# Patient Record
Sex: Female | Born: 1975 | Race: Black or African American | Hispanic: No | Smoking: Never smoker
Health system: Southern US, Community
[De-identification: ages and names within clinical notes are randomized; demographics above are authoritative.]

## PROBLEM LIST (undated history)

## (undated) ENCOUNTER — Inpatient Hospital Stay (HOSPITAL_COMMUNITY): Payer: Self-pay

## (undated) DIAGNOSIS — R87629 Unspecified abnormal cytological findings in specimens from vagina: Secondary | ICD-10-CM

## (undated) DIAGNOSIS — D259 Leiomyoma of uterus, unspecified: Secondary | ICD-10-CM

## (undated) DIAGNOSIS — D649 Anemia, unspecified: Secondary | ICD-10-CM

## (undated) DIAGNOSIS — O09529 Supervision of elderly multigravida, unspecified trimester: Secondary | ICD-10-CM

## (undated) DIAGNOSIS — Z87898 Personal history of other specified conditions: Secondary | ICD-10-CM

## (undated) DIAGNOSIS — Z973 Presence of spectacles and contact lenses: Secondary | ICD-10-CM

## (undated) DIAGNOSIS — B029 Zoster without complications: Secondary | ICD-10-CM

## (undated) HISTORY — PX: NO PAST SURGERIES: SHX2092

## (undated) HISTORY — DX: Supervision of elderly multigravida, unspecified trimester: O09.529

## (undated) HISTORY — PX: COMBINED HYSTEROSCOPY DIAGNOSTIC / D&C: SUR297

## (undated) HISTORY — DX: Anemia, unspecified: D64.9

## (undated) HISTORY — DX: Unspecified abnormal cytological findings in specimens from vagina: R87.629

---

## 1999-11-18 ENCOUNTER — Emergency Department (HOSPITAL_COMMUNITY): Admission: EM | Admit: 1999-11-18 | Discharge: 1999-11-18 | Payer: Self-pay | Admitting: Emergency Medicine

## 2000-04-19 ENCOUNTER — Encounter (INDEPENDENT_AMBULATORY_CARE_PROVIDER_SITE_OTHER): Payer: Self-pay | Admitting: Specialist

## 2000-04-19 ENCOUNTER — Other Ambulatory Visit: Admission: RE | Admit: 2000-04-19 | Discharge: 2000-04-19 | Payer: Self-pay | Admitting: Obstetrics and Gynecology

## 2005-04-20 ENCOUNTER — Other Ambulatory Visit: Admission: RE | Admit: 2005-04-20 | Discharge: 2005-04-20 | Payer: Self-pay | Admitting: Obstetrics and Gynecology

## 2005-09-11 ENCOUNTER — Emergency Department (HOSPITAL_COMMUNITY): Admission: EM | Admit: 2005-09-11 | Discharge: 2005-09-11 | Payer: Self-pay | Admitting: Emergency Medicine

## 2007-11-28 ENCOUNTER — Emergency Department (HOSPITAL_COMMUNITY): Admission: EM | Admit: 2007-11-28 | Discharge: 2007-11-28 | Payer: Self-pay | Admitting: Emergency Medicine

## 2007-11-30 ENCOUNTER — Inpatient Hospital Stay (HOSPITAL_COMMUNITY): Admission: EM | Admit: 2007-11-30 | Discharge: 2007-12-03 | Payer: Self-pay | Admitting: Emergency Medicine

## 2007-12-28 ENCOUNTER — Ambulatory Visit: Payer: Self-pay | Admitting: Internal Medicine

## 2007-12-28 DIAGNOSIS — J189 Pneumonia, unspecified organism: Secondary | ICD-10-CM | POA: Insufficient documentation

## 2008-01-04 ENCOUNTER — Encounter (INDEPENDENT_AMBULATORY_CARE_PROVIDER_SITE_OTHER): Payer: Self-pay | Admitting: *Deleted

## 2008-09-21 IMAGING — CR DG CHEST 2V
2 series · 2 of 2 positions shown · non-contrast
Comparison: Chest x-ray of 12/02/2007

CLINICAL DATA: Recent pneumonia, follow-up

CHEST - 2 VIEW

[view not recorded (1 of 2)]
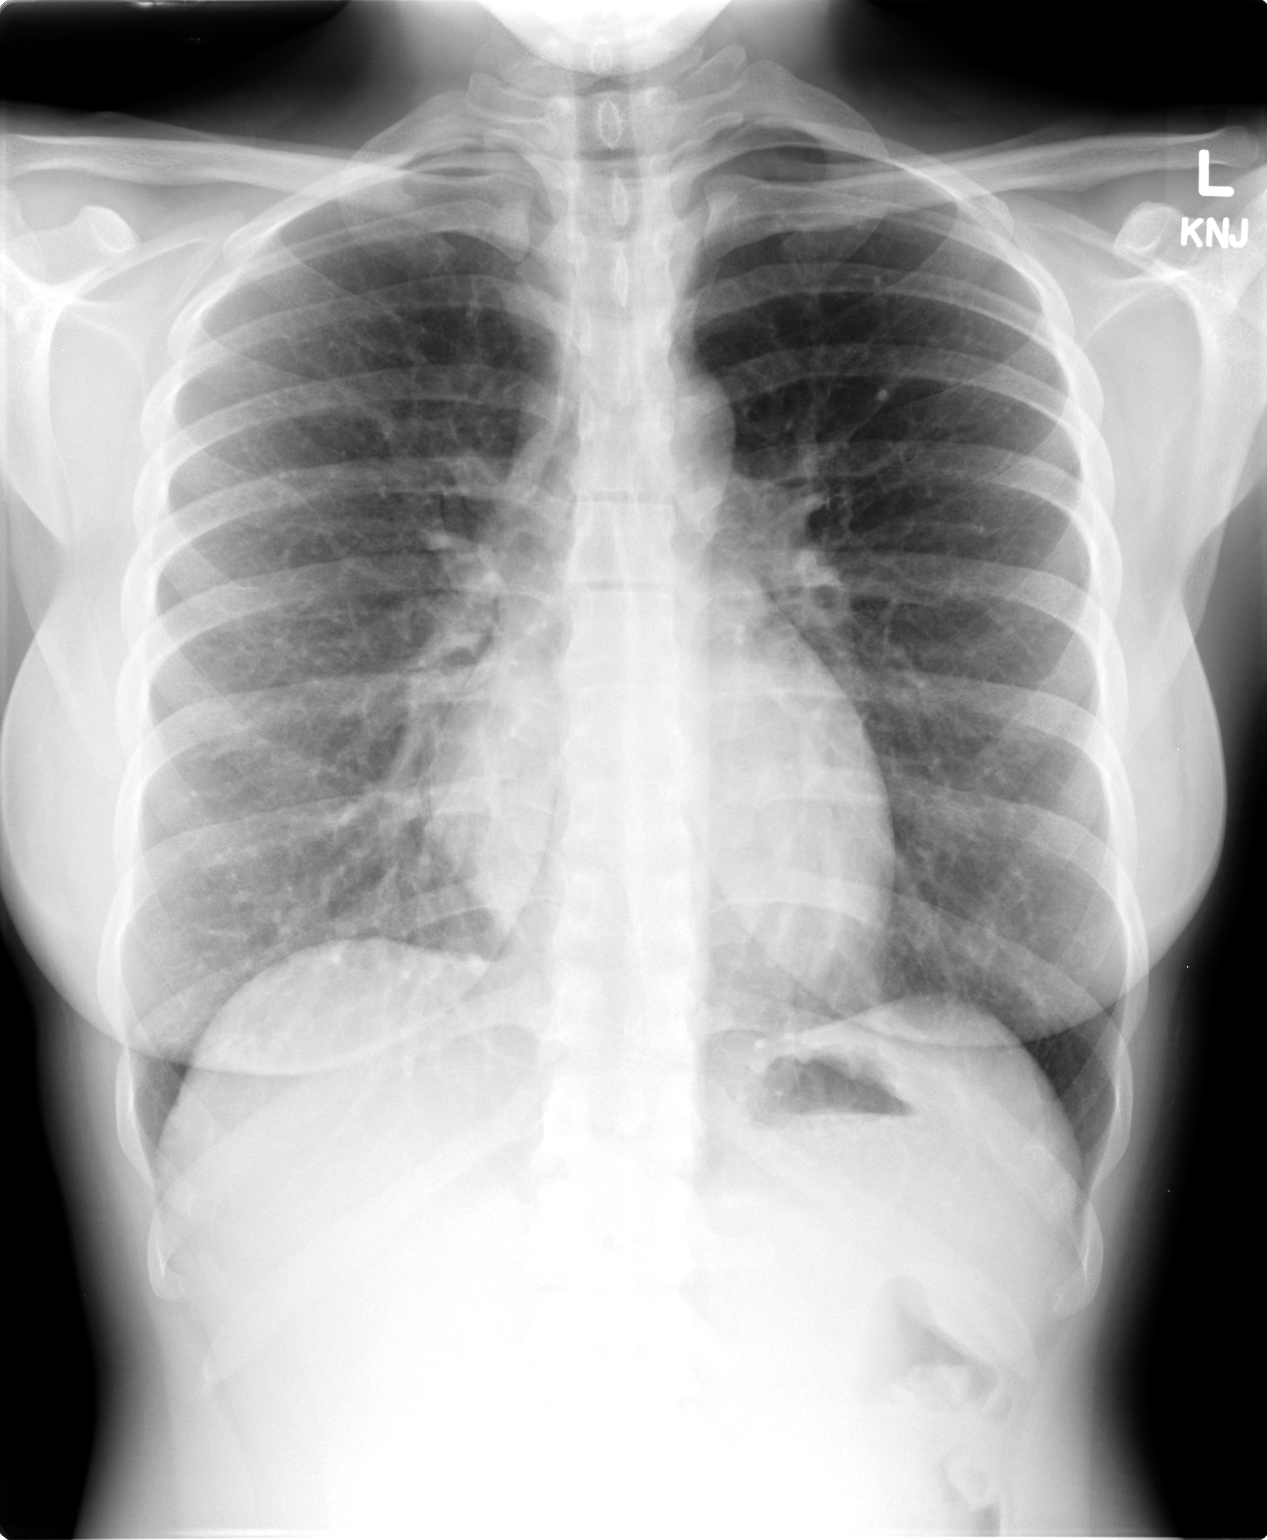

[view not recorded (2 of 2)]
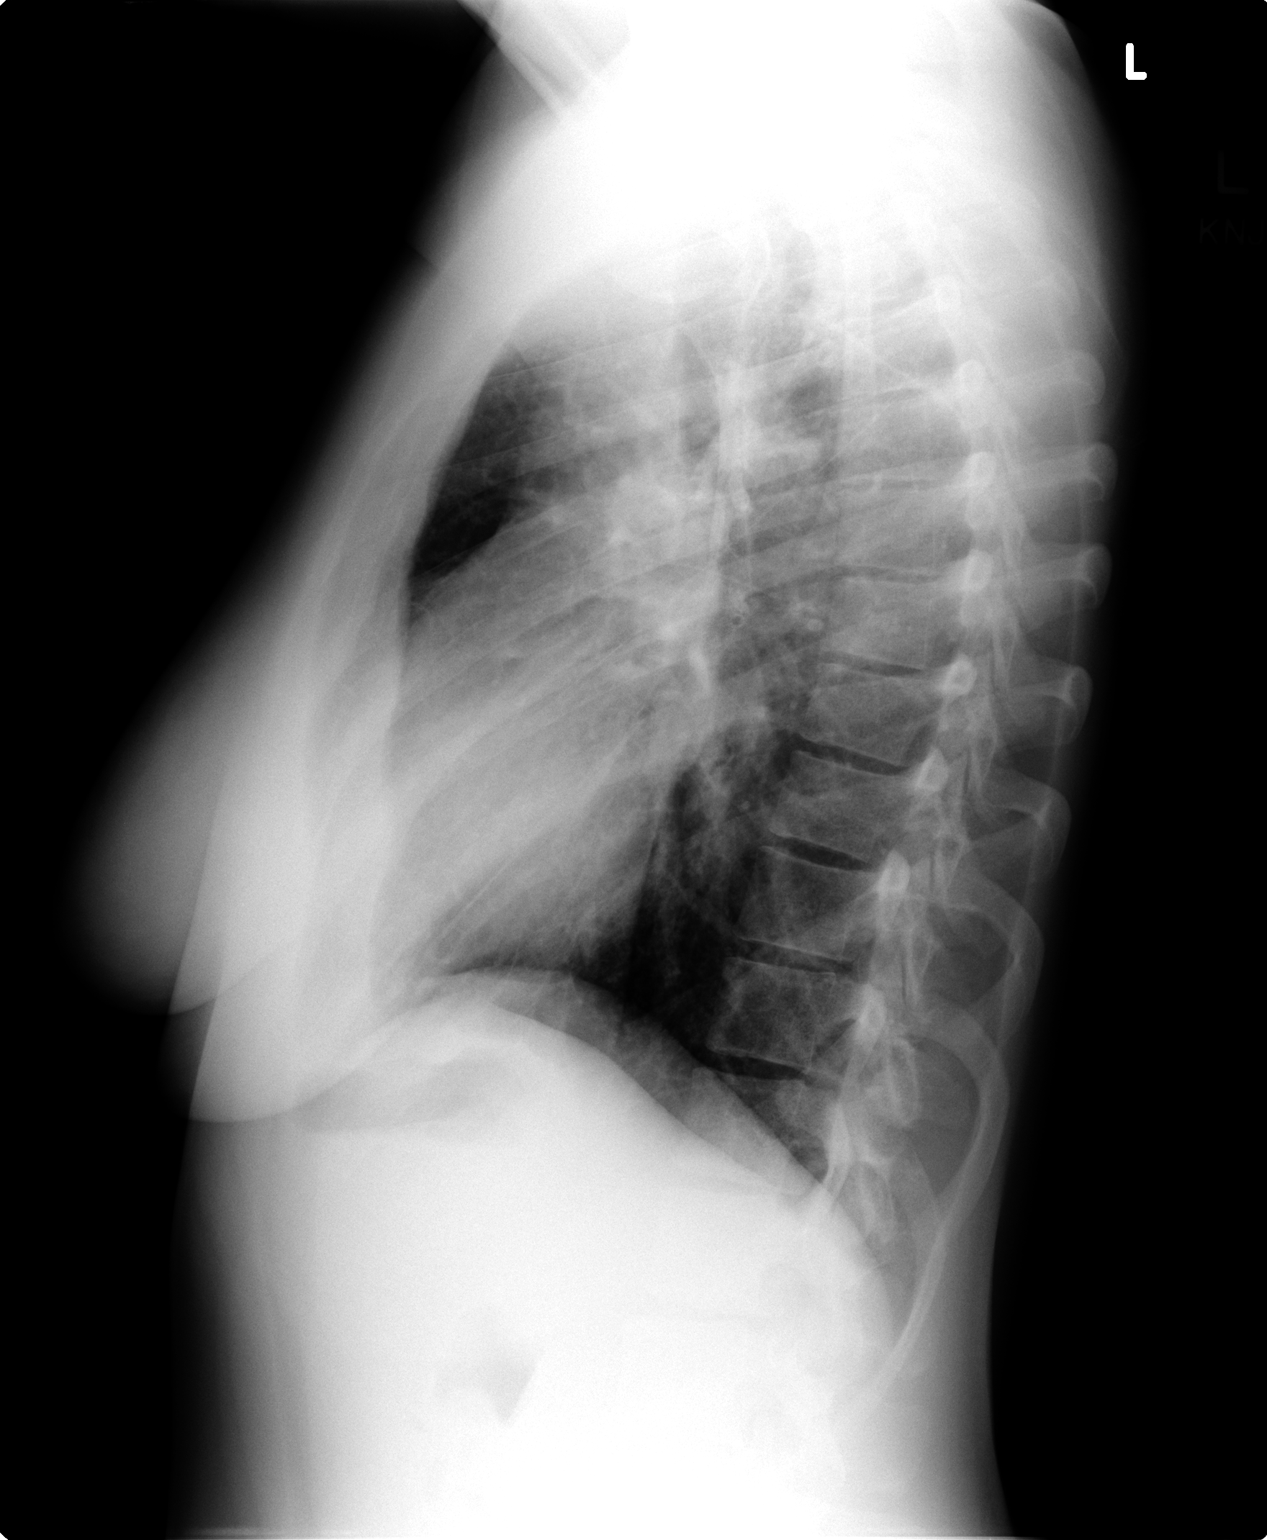

[2 of 2 positions shown; findings below may reference images not displayed]

FINDINGS: The patchy pneumonia particularly throughout the right
lung on prior chest x-ray has resolved.  No active infiltrate or
effusion is seen.  Heart is within normal limits in size.  No bony
abnormality is seen.
IMPRESSION: Resolution of pneumonia.  No active lung disease.

## 2010-08-11 NOTE — Assessment & Plan Note (Signed)
Summary: and   Visit Type:  Initial Consult PCP:  Della Goo  Chief Complaint:  Pulmonary Consult to evaluate PNA.Elizabeth King  History of Present Illness: 35 ybf never smoker abruptly L. for several days prior to admission to [facility] on 11/29/2007 with a febrile illness associated with cough and diffuse infiltrates on chest x-ray consistent with the diagnosis of pneumonia.  She was treated by the hospital service and discharged on doxycycline to complete 10 days of therapy and now says she is 100% back to normal although is not aerobically active yet.  she specifically denies however at this point any sinus complaints or cough sore throat chest pain dyspnea fevers chills nausea myalgias arthralgias or skin rash and is eating and drinking normally,     Current Allergies: No known allergies   Past Medical History:     essentially healthy   Family History:    mother and maternal grandmother hx of heart dz     maternal grandmother-lung cancer  Social History:    Married    Never smoker    ETOH on occ    Review of Systems  The patient denies anorexia, fever, weight loss, weight gain, vision loss, decreased hearing, hoarseness, chest pain, syncope, dyspnea on exhertion, peripheral edema, prolonged cough, hemoptysis, abdominal pain, melena, hematochezia, severe indigestion/heartburn, hematuria, incontinence, muscle weakness, suspicious skin lesions, transient blindness, difficulty walking, depression, unusual weight change, abnormal bleeding, enlarged lymph nodes, and angioedema.     Vital Signs:  Patient Profile:   35 Years Old Female Weight:      152.50 pounds O2 Sat:      100 % O2 treatment:    Room Air Temp:     98.9 degrees F oral Pulse rate:   83 / minute BP sitting:   110 / 78  (left arm)  Vitals Entered By: Vernie Murders (December 28, 2007 2:39 PM)                 Physical Exam    Ambulatory healthy appearing in no acute distress. Afeb with normal vital  signs HEENT: nl dentition, turbinates, and orophanx. Nl external ear canals without cough reflex Neck without JVD/Nodes/TM Lungs clear to A and P bilaterally without cough on insp or exp maneuvers RRR no s3 or murmur or increase in P2 Abd soft and benign with nl excursion in the supine position. No bruits or organomegaly Ext warm without calf tenderness, cyanosis clubbing or edema Skin warm and dry without lesions     CXR  Procedure date:  12/28/2007  Findings:      chest x-ray shows complete clearing of bilateral infiltrates    Impression & Recommendations:  Problem # 1:  PNEUMONIA ORGANISM NOS (ICD-486) she is 100% improved radiographically and most likely also clinically although I warned her she may not have normal exercise tolerance for up to 6 weeks.  However, as she is a never smoker her long-term prognosis is excellent and pulmonary fu  can be p.r.n.  Orders: T-2 View CXR, Same Day (71020.5TC) New Patient Level IV ([address])    Patient Instructions: 1)  if not 100% activity tolerance in 6weeks return to the pulmonary clinic   ]

## 2010-08-11 NOTE — Letter (Signed)
Summary: [place] [facility] Results Follow Up Letter   [facility]care Pul[facility]  [address]   [place], [place] [address]   Phone: [phone]  Fax: [phone]    01/04/2008 MRN: [id number]  [facility] [place] [id number] [place], [place]  [address]  Dear Ms. Benita Stabile,  We have received the results from your recent tests and have been unable to contact you.  Please call our office at [phone] so that Dr. Sherene Sires or his nurse may review the results with you.    Thank you,  Nature conservation officer Pulmonary Division

## 2010-11-24 NOTE — Discharge Summary (Signed)
NAMEMarland Kitchen  Elizabeth King, Elizabeth King       ACCOUNT NO.:  [id number]   MEDICAL RECORD NO.:  [id number]          PATIENT TYPE:  INP   LOCATION:  5525                         FACILITY:  [facility]   PHYSICIAN:  Elliot Cousin, M.D.    DATE OF BIRTH:  December 28, 1975   DATE OF ADMISSION:  11/29/2007  DATE OF DISCHARGE:  12/03/2007                               DISCHARGE SUMMARY   DISCHARGE DIAGNOSES:  1. Bilateral diffuse airspace disease per CT scan of the chest.  2. Incidental finding of a 3.5-cm lesion, possibly secondary to      cavernous hemangioma per CT scan of the chest.  3. Transient leukopenia  4. Vaginitis thought to be secondary to Candida.   DISCHARGE MEDICATIONS:  1. Doxycycline 100 mg b.i.d. for 10 more days.  2. Albuterol inhaler two  puffs every 4 hours as needed for shortness      of breath.  3. Tylenol and ibuprofen (over the counter), take as needed and as      directed.   DISCHARGE DISPOSITION:  The patient is being discharged to home in  improved and stable condition on Dec 03, 2007.  Arrangements are being  made for the patient to follow up with Dr. Della Goo in 5-7 days.  The patient will need a follow-up CT scan of the chest in approximately  4 weeks to assess for interval changes.   CONSULTATIONS:  None, although a pulmonary consultation was recommended  and the patient refused.   PROCEDURES PERFORMED:  1. Chest x-ray on Dec 02, 2007.  The results revealed no interval      improvement in bilateral polylobar pneumonia.  2. CT angiogram of the chest on Nov 30, 2007.  The results revealed      diffuse bilateral air space disease.  Findings suggestive for      multifocal pneumonia.  Incidental finding of a 3.5-cm lesion in the      right hepatic lobe which is suggestive of a cavernous hemangioma.      Limited MRI of the abdomen could better characterize.   HISTORY OF PRESENT ILLNESS:  The patient is a 35 year old woman with no  significant past medical history who  presented to the emergency  department on Nov 29, 2007, with a chief complaint of shortness of  breath, myalgias, fatigue, and subjective fever.  When the patient was  evaluated in the emergency department, her temperature was found to be  99.2.  Her white blood cell count was 4.2.  Her D-dimer was elevated at  2.65.  Her chest x-ray revealed diffuse pulmonary airspace opacities.  The patient was therefore admitted for further evaluation and  management.   For additional details. please see the dictated history and physical.   HOSPITAL COURSE:  Bilateral infiltrates, thought to be secondary to  polylobar pneumonia.  The patient was started on empiric antibiotic  treatment with Rocephin and azithromycin.  Blood cultures were ordered  as well.  Symptomatic treatment was started with oxygen therapy as well  as albuterol and Atrovent nebulizers.  The patient was also treated  symptomatically with Mucinex and as-needed Robitussin.  Pain management  was instituted with  as-needed oxycodone and Dilaudid.  Given the  elevated D-dimer, a CT scan of the chest was ordered to rule out PE.  The CT scan of the chest did not reveal evidence of a large pulmonary  embolism.  The CT angiogram was limited, however.  It did show bilateral  diffuse air space disease suggestive of multifocal pneumonia.  For  further evaluation, influenza A and B antigen screen was ordered as well  as a streptococcal pneumoniae urinary antigen.  Both tests were  negative.  The patient's ABG on room air revealed a pH of 7.49, pCO2 of  30, and pO2 of 86.  Subsequently, a urine Legionella antigen and sed  rate were ordered.  The urine Legionella antigen was negative and the  patient's sed rate was mildly elevated at 41.   The following day, the patient's white blood cell count fell to 2.6.  Given the transient leukopenia, an HIV screen was ordered.  The result  is currently pending at the time of this dictation.   After 24  hours of Rocephin and azithromycin therapy, the decision was  made to change antibiotic treatment to doxycycline.  Therefore, the  patient was started on intravenous doxycycline 100 mg q.12h.  A follow-  up chest x-ray revealed no improvement.  However, the patient felt  better and appeared to be clinically improving.  Her white blood cell  count improved to 4.9 prior to hospital discharge.  She has been  oxygenating in the upper 90s on room air at rest.  She ambulated with  the nursing staff prior to hospital discharge and her oxygen saturations  ranged from 97-99% on room air.  A pulmonary consultation was  recommended; however, the patient refused.  She wanted to go home now.  Therefore, the patient will be discharged to home on 10 more days of  doxycycline therapy.  The patient has been instructed to call the  dictating physician in 2 days for the results of the HIV screen.  Arrangements are being made for the patient to follow up with her new  primary care physician, Dr. Della Goo, later in the week.  A  follow-up CT scan of the chest is recommended in approximately 4 weeks  to assess for interval findings.  The patient voiced understanding.  Of  note, the patient's blood cultures remained negative during the  hospitalization.      Elliot Cousin, M.D.  Electronically Signed     DF/MEDQ  D:  12/03/2007  T:  12/03/2007  Job:  [id number]   cc:   Della Goo, M.D.

## 2010-11-24 NOTE — H&P (Signed)
NAME:  Elizabeth King, Elizabeth King NO.:  [id number]   MEDICAL RECORD NO.:  [id number]          PATIENT TYPE:  EMS   LOCATION:  MAJO                         FACILITY:  [facility]   PHYSICIAN:  Lucita Ferrara, MD         DATE OF BIRTH:  1976-05-23   DATE OF ADMISSION:  11/29/2007  DATE OF DISCHARGE:                              HISTORY & PHYSICAL   The patient is a 35 year old female who presents to [facility] with a chief complaint of shortness of breath that has been  ongoing since last Saturday, getting progressively worse.  Symptoms  initiated with diffuse myalgia, body aches, fatigue.  She presented to  urgent care center yesterday.  She was diagnosed with dehydration, yet  her influenza test was apparently negative.  She states that her  symptoms have been getting progressively worse and she has been getting  progressively more dyspneic.  She has no orthopnea.  She has no  exertional chest pain.  She is feverish and her fevers were as high as  103.  She had no relief with over-the-counter cough medicine.  Her cough  is nonproductive now.  She denies a history of recent travel, sick  contacts.  She denies a history of immune deficiency or risk factors of.  She denies history of contact with individuals with influenza.   PAST MEDICAL HISTORY:  None.   PAST SURGICAL HISTORY:  None.   FAMILY HISTORY:  Noncontributory.   SOCIAL HISTORY:  She denies drugs, alcohol or tobacco.   CONTRACEPTION:  None.   ALLERGIES:  No known drug allergies.   MEDICATIONS:  None.   REVIEW OF SYSTEMS:  Otherwise negative.  She denies any weight loss.   PHYSICAL EXAMINATION:  Her blood pressure is 103/67, pulse is 87,  respirations 18, temperature 99.2, pulse oximetry 96% on room air.  HEENT:  Mucous membranes are somewhat dry but generally speaking, she is  normocephalic, atraumatic.  Sclerae anicteric.  GENERAL:  She is well-  developed, a pleasant female in no acute distress.  CARDIOVASCULAR:  S2, S2, regular rate and rhythm.  No murmurs, rubs,  clicks.  LUNGS:  They are actually clear to auscultation bilaterally.  No  rhonchi, rales or wheezes.  ABDOMEN:  Soft, nontender, not distended.  Positive bowel sounds.  EXTREMITIES:  No clubbing, cyanosis or edema.  NEUROLOGIC:  The patient is alert and oriented x3.  Cranial nerves II-  XII grossly intact.   LABORATORY DATA:  She had a complete metabolic panel with pretty much  normal results with a mildly elevated bilirubin of 1.4, albumin mildly  low at 3.4.  D-dimer is a positive 2.65.  CBC shows a white count of  4.2, hemoglobin 15.1, hematocrit 45.3, MCV 85.8, platelets 318.  Lipase  was 22.  Hepatic function test shows an alkaline phosphatase of 61, AST  of 41, indirect bili of 1.5, total bili of 2.1.  her Sharen Hones shows a  sodium 134.  Urine shows negative nitrites and leukocyte esterase, large  bilirubin.  Monospot is negative.  Influenza A and B from yesterday is  negative, nasal swab.  RADIOLOGICAL DATA:  CT angio of the chest shows bilateral diffuse  airspace disease, a finding suggestive of multifocal pneumonia.  Chest x-  ray shows diffuse pulmonary airspace opacity, likely represents  multifocal pneumonia, including atypical infection.   ASSESSMENT/PLAN:  A 36 year old with fevers/chills/myalgia and shortness  of breath that is getting progressively worse.  Her CT angiogram was  negative for pulmonary embolism but positive for atypical bilateral  airspace disease and pneumonia.  Could represent Mycoplasma pneumonia.  Could still be influenza, yet her influenza test swab was negative.  Her  Monospot was also negative.  Rule out Legionella, rule out human  immunodeficiency virus.   PLAN:  Essentially we will have to re-swab for influenza.  We will have  to get a consent for HIV test.  Given her increased liver function  tests, we will get hepatitis A, B and C, sputum for bacterial culture  x2, urine  for pneumococcal antigen and Legionella antigen.  Will treat  empirically with Zithromax and Rocephin, supplemental oxygen.  Will  monitor her saturations, IV fluids, IV antiemetics with Zofran.  The  patient's course is complicated.  If need be, we will involve pulmonary  medicine.  In addition, if the patient's respiratory status deteriorates  she will be transferred to the intensive care unit and treated  aggressively. I have explained all the plans described above to the  patient and family, and they understand.      Lucita Ferrara, MD  Electronically Signed     RR/MEDQ  D:  11/30/2007  T:  11/30/2007  Job:  [id number]

## 2011-04-07 LAB — COMPREHENSIVE METABOLIC PANEL
ALT: 13
ALT: 13
ALT: 17
AST: 16
AST: 22
AST: 36
Albumin: 2.9 — ABNORMAL LOW
Albumin: 3 — ABNORMAL LOW
Albumin: 3.4 — ABNORMAL LOW
Alkaline Phosphatase: 56
Alkaline Phosphatase: 61
Alkaline Phosphatase: 65
BUN: 2 — ABNORMAL LOW
BUN: 2 — ABNORMAL LOW
BUN: 4 — ABNORMAL LOW
CO2: 25
CO2: 27
CO2: 28
Calcium: 9.1
Calcium: 9.3
Calcium: 9.3
Chloride: 102
Chloride: 104
Chloride: 107
Creatinine, Ser: 0.71
Creatinine, Ser: 0.73
Creatinine, Ser: 0.75
GFR calc Af Amer: >60
GFR calc Af Amer: >60
GFR calc Af Amer: >60
GFR calc non Af Amer: >60
GFR calc non Af Amer: >60
GFR calc non Af Amer: >60
Glucose, Bld: 121 — ABNORMAL HIGH
Glucose, Bld: 88
Glucose, Bld: 99
Potassium: 3.7
Potassium: 4
Potassium: 4.2
Sodium: 137
Sodium: 139
Sodium: 140
Total Bilirubin: 0.7
Total Bilirubin: 0.7
Total Bilirubin: 1.4 — ABNORMAL HIGH
Total Protein: 6.7
Total Protein: 7.1
Total Protein: 7.5

## 2011-04-07 LAB — CBC
HCT: 33.9 — ABNORMAL LOW
HCT: 37.8
HCT: 39.1
HCT: 45.3
HCT: 47.4 — ABNORMAL HIGH
Hemoglobin: 11.4 — ABNORMAL LOW
Hemoglobin: 12.7
Hemoglobin: 13.6
Hemoglobin: 15.1 — ABNORMAL HIGH
Hemoglobin: 15.9 — ABNORMAL HIGH
MCHC: 33.4
MCHC: 33.5
MCHC: 33.5
MCHC: 33.5
MCHC: 34.7
MCV: 84.7
MCV: 85.2
MCV: 85.5
MCV: 85.6
MCV: 85.8
Platelets: 228
Platelets: 282
Platelets: 304
Platelets: 318
Platelets: 361
RBC: 3.97
RBC: 4.43
RBC: 4.62
RBC: 5.28 — ABNORMAL HIGH
RBC: 5.56 — ABNORMAL HIGH
RDW: 14
RDW: 14.3
RDW: 14.3
RDW: 14.4
RDW: 14.6
WBC: 2.6 — ABNORMAL LOW
WBC: 3.1 — ABNORMAL LOW
WBC: 3.5 — ABNORMAL LOW
WBC: 4.2
WBC: 4.9

## 2011-04-07 LAB — POCT I-STAT 3, ART BLOOD GAS (G3+)
Bicarbonate: 22.9
O2 Saturation: 97
Operator id: 284591
TCO2: 24
pCO2 arterial: 30 — ABNORMAL LOW
pH, Arterial: 7.491 — ABNORMAL HIGH
pO2, Arterial: 86

## 2011-04-07 LAB — POCT URINALYSIS DIP (DEVICE)
Glucose, UA: NEGATIVE
Hgb urine dipstick: NEGATIVE
Ketones, ur: 80 — AB
Nitrite: NEGATIVE
Operator id: 282152
Protein, ur: 100 — AB
Specific Gravity, Urine: 1.02
Urobilinogen, UA: 2 — ABNORMAL HIGH
pH: 6

## 2011-04-07 LAB — DIFFERENTIAL
Basophils Absolute: 0
Basophils Absolute: 0
Basophils Relative: 0
Basophils Relative: 1
Eosinophils Absolute: 0
Eosinophils Absolute: 0
Eosinophils Relative: 0
Eosinophils Relative: 1
Lymphocytes Relative: 32
Lymphocytes Relative: 35
Lymphs Abs: 1
Lymphs Abs: 1.5
Monocytes Absolute: 0.2
Monocytes Absolute: 0.2
Monocytes Relative: 5
Monocytes Relative: 6
Neutro Abs: 1.9
Neutro Abs: 2.5
Neutrophils Relative %: 60
Neutrophils Relative %: 61

## 2011-04-07 LAB — LEGIONELLA ANTIGEN, URINE: Legionella Antigen, Urine: NEGATIVE

## 2011-04-07 LAB — POCT INFECTIOUS MONO SCREEN: Mono Screen: NEGATIVE

## 2011-04-07 LAB — POCT I-STAT, CHEM 8
BUN: 5 — ABNORMAL LOW
Calcium, Ion: 1.11 — ABNORMAL LOW
Chloride: 100
Creatinine, Ser: 1.2
Glucose, Bld: 99
HCT: 50 — ABNORMAL HIGH
Hemoglobin: 17 — ABNORMAL HIGH
Potassium: 4.5
Sodium: 134 — ABNORMAL LOW
TCO2: 23

## 2011-04-07 LAB — STREP PNEUMONIAE URINARY ANTIGEN: Strep Pneumo Urinary Antigen: NEGATIVE

## 2011-04-07 LAB — HEMOGLOBIN A1C
Hgb A1c MFr Bld: 5.6
Mean Plasma Glucose: 122

## 2011-04-07 LAB — HEPATIC FUNCTION PANEL
ALT: 16
AST: 41 — ABNORMAL HIGH
Albumin: 3.5
Alkaline Phosphatase: 61
Bilirubin, Direct: 0.6 — ABNORMAL HIGH
Indirect Bilirubin: 1.5 — ABNORMAL HIGH
Total Bilirubin: 2.1 — ABNORMAL HIGH
Total Protein: 7.4

## 2011-04-07 LAB — CULTURE, BLOOD (ROUTINE X 2)
Culture: NO GROWTH
Culture: NO GROWTH

## 2011-04-07 LAB — BASIC METABOLIC PANEL
BUN: 2 — ABNORMAL LOW
CO2: 27
Calcium: 8.6
Chloride: 107
Creatinine, Ser: 0.66
GFR calc Af Amer: >60
GFR calc non Af Amer: >60
Glucose, Bld: 95
Potassium: 4
Sodium: 138

## 2011-04-07 LAB — HEPATITIS PANEL, ACUTE
HCV Ab: NEGATIVE
Hep A IgM: NEGATIVE
Hep B C IgM: NEGATIVE
Hepatitis B Surface Ag: NEGATIVE

## 2011-04-07 LAB — INFLUENZA A AND B ANTIGEN (CONVERTED LAB)
Inflenza A Ag: NEGATIVE
Influenza B Ag: NEGATIVE

## 2011-04-07 LAB — MAGNESIUM
Magnesium: 1.8
Magnesium: 2

## 2011-04-07 LAB — LIPASE, BLOOD: Lipase: 22

## 2011-04-07 LAB — PHOSPHORUS
Phosphorus: 3.4
Phosphorus: 3.6

## 2011-04-07 LAB — D-DIMER, QUANTITATIVE: D-Dimer, Quant: 2.65 — ABNORMAL HIGH

## 2011-04-07 LAB — INFLUENZA A+B VIRUS AG-DIRECT(RAPID)
Inflenza A Ag: NEGATIVE
Influenza B Ag: NEGATIVE

## 2011-04-07 LAB — PREGNANCY, URINE: Preg Test, Ur: NEGATIVE

## 2011-04-07 LAB — HIV-1 RNA, QUALITATIVE, TMA: HIV-1 RNA, Qualitative, TMA: NOT DETECTED

## 2011-04-07 LAB — SEDIMENTATION RATE: Sed Rate: 41 — ABNORMAL HIGH

## 2012-10-11 ENCOUNTER — Ambulatory Visit: Payer: Self-pay

## 2012-10-11 ENCOUNTER — Other Ambulatory Visit: Payer: Self-pay | Admitting: Occupational Medicine

## 2012-10-11 DIAGNOSIS — R7612 Nonspecific reaction to cell mediated immunity measurement of gamma interferon antigen response without active tuberculosis: Secondary | ICD-10-CM

## 2013-05-02 LAB — OB RESULTS CONSOLE GC/CHLAMYDIA
Chlamydia: NEGATIVE
Gonorrhea: NEGATIVE

## 2013-05-02 LAB — OB RESULTS CONSOLE RUBELLA ANTIBODY, IGM: Rubella: IMMUNE

## 2013-05-02 LAB — OB RESULTS CONSOLE HEPATITIS B SURFACE ANTIGEN: Hepatitis B Surface Ag: NEGATIVE

## 2013-05-02 LAB — OB RESULTS CONSOLE ABO/RH: RH Type: POSITIVE

## 2013-05-02 LAB — OB RESULTS CONSOLE ANTIBODY SCREEN: Antibody Screen: NEGATIVE

## 2013-05-02 LAB — OB RESULTS CONSOLE RPR: RPR: NONREACTIVE

## 2013-05-02 LAB — OB RESULTS CONSOLE HIV ANTIBODY (ROUTINE TESTING): HIV: NONREACTIVE

## 2013-05-21 ENCOUNTER — Encounter (HOSPITAL_COMMUNITY): Payer: Self-pay

## 2013-05-21 ENCOUNTER — Inpatient Hospital Stay (HOSPITAL_COMMUNITY)
Admission: AD | Admit: 2013-05-21 | Discharge: 2013-05-21 | Disposition: A | Discharge status: Home / Self Care | Payer: BC Managed Care – PPO | Source: Ambulatory Visit | Attending: Obstetrics and Gynecology | Admitting: Obstetrics and Gynecology

## 2013-05-21 DIAGNOSIS — R339 Retention of urine, unspecified: Secondary | ICD-10-CM | POA: Insufficient documentation

## 2013-05-21 DIAGNOSIS — O99891 Other specified diseases and conditions complicating pregnancy: Secondary | ICD-10-CM | POA: Insufficient documentation

## 2013-05-21 HISTORY — DX: Leiomyoma of uterus, unspecified: D25.9

## 2013-05-21 LAB — URINALYSIS, ROUTINE W REFLEX MICROSCOPIC
Bilirubin Urine: NEGATIVE
Glucose, UA: NEGATIVE mg/dL
Hgb urine dipstick: NEGATIVE
Ketones, ur: NEGATIVE mg/dL
Leukocytes, UA: NEGATIVE
Nitrite: NEGATIVE
Protein, ur: NEGATIVE mg/dL
Specific Gravity, Urine: 1.01 (ref 1.005–1.030)
Urobilinogen, UA: 0.2 mg/dL (ref 0.0–1.0)
pH: 7 (ref 5.0–8.0)

## 2013-05-21 LAB — POCT PREGNANCY, URINE: Preg Test, Ur: POSITIVE — AB

## 2013-05-21 NOTE — MAU Note (Signed)
Pt states that since 0100 she has not been able to urinate. Last urinated around 10 or 1030pm.   States she has 3 fibroids. Has had this problem since becoming pregnant. Denies burning or pain with urination.

## 2013-05-21 NOTE — MAU Provider Note (Signed)
  History     CSN: [id number]  Arrival date and time: 05/21/13 7829   First Provider Initiated Contact with Patient 05/21/13 [phone]      Chief Complaint  Patient presents with  . Urinary Retention   HPI  Elizabeth King is a 37 y.o. G1P0 at [redacted] weeks gestation who presents today because she is unable to void. She states that this same thing occurred about one week ago, and she eventually voided. She states that the last time she remembers voiding was around 2200 last night. She was able to void a small amount to obtain a UA sample. She has a known history of three fibroids, and a prolapsed uterus.   No past medical history on file.  No past surgical history on file.  No family history on file.  History  Substance Use Topics  . Smoking status: Not on file  . Smokeless tobacco: Not on file  . Alcohol Use: Not on file    Allergies: Allergies not on file  No prescriptions prior to admission    ROS Physical Exam   Pulse 72, temperature 97.8 F (36.6 C), temperature source Oral, resp. rate 18, last menstrual period 09/20/2012, SpO2 99.00%.  Physical Exam  Nursing note and vitals reviewed. Constitutional: She is oriented to person, place, and time. She appears well-developed and well-nourished.  Cardiovascular: Normal rate.   Respiratory: Effort normal.  GI: Soft. There is no tenderness.  Genitourinary:   External: no lesion Vagina: cervix seen just at the introitus, cervix replaced, and patient felt some relief of the pressure. She attempted to void, but was still unable to void.  Uterus: about 14 weeks size.   Neurological: She is alert and oriented to person, place, and time.  Skin: Skin is warm and dry.  Psychiatric: She has a normal mood and affect.    MAU Course  Procedures  Results for orders placed during the hospital encounter of 05/21/13 (from the past 24 hour(s))  URINALYSIS, ROUTINE W REFLEX MICROSCOPIC     Status: Abnormal   Collection Time   05/21/13  5:40 AM      Result Value Range   Color, Urine YELLOW  YELLOW   APPearance HAZY (*) CLEAR   Specific Gravity, Urine 1.010  1.005 - 1.030   pH 7.0  5.0 - 8.0   Glucose, UA NEGATIVE  NEGATIVE mg/dL   Hgb urine dipstick NEGATIVE  NEGATIVE   Bilirubin Urine NEGATIVE  NEGATIVE   Ketones, ur NEGATIVE  NEGATIVE mg/dL   Protein, ur NEGATIVE  NEGATIVE mg/dL   Urobilinogen, UA 0.2  0.0 - 1.0 mg/dL   Nitrite NEGATIVE  NEGATIVE   Leukocytes, UA NEGATIVE  NEGATIVE  POCT PREGNANCY, URINE     Status: Abnormal   Collection Time    05/21/13  5:52 AM      Result Value Range   Preg Test, Ur POSITIVE (*) NEGATIVE    0640: Patient I&O cath for 700cc, reports feeling better.   3086: D/W Dr. Henderson Cloud he states that patient needs to call the office and let them know that she needs to be seen before lunchtime today. Assessment and Plan   1. Urinary retention    Patient will call the office to be seen today prior to lunchtime  Tawnya Crook 05/21/2013, 6:18 AM

## 2013-05-22 LAB — URINE CULTURE: Culture: 25000

## 2013-06-01 ENCOUNTER — Encounter (HOSPITAL_COMMUNITY): Payer: Self-pay | Admitting: *Deleted

## 2013-06-01 ENCOUNTER — Inpatient Hospital Stay (HOSPITAL_COMMUNITY)
Admission: AD | Admit: 2013-06-01 | Discharge: 2013-06-01 | Disposition: A | Discharge status: Home / Self Care | Payer: BC Managed Care – PPO | Source: Ambulatory Visit | Attending: Obstetrics and Gynecology | Admitting: Obstetrics and Gynecology

## 2013-06-01 DIAGNOSIS — O34599 Maternal care for other abnormalities of gravid uterus, unspecified trimester: Secondary | ICD-10-CM | POA: Insufficient documentation

## 2013-06-01 DIAGNOSIS — R339 Retention of urine, unspecified: Secondary | ICD-10-CM | POA: Insufficient documentation

## 2013-06-01 DIAGNOSIS — O99891 Other specified diseases and conditions complicating pregnancy: Secondary | ICD-10-CM | POA: Insufficient documentation

## 2013-06-01 DIAGNOSIS — R109 Unspecified abdominal pain: Secondary | ICD-10-CM | POA: Insufficient documentation

## 2013-06-01 LAB — URINALYSIS, ROUTINE W REFLEX MICROSCOPIC
Bilirubin Urine: NEGATIVE
Glucose, UA: NEGATIVE mg/dL
Hgb urine dipstick: NEGATIVE
Ketones, ur: NEGATIVE mg/dL
Leukocytes, UA: NEGATIVE
Nitrite: NEGATIVE
Protein, ur: NEGATIVE mg/dL
Specific Gravity, Urine: 1.01 (ref 1.005–1.030)
Urobilinogen, UA: 0.2 mg/dL (ref 0.0–1.0)
pH: 7 (ref 5.0–8.0)

## 2013-06-01 NOTE — MAU Note (Signed)
PT SAYS  THE LAST TIME SHE VOIDED WAS AT 10 PM- SHE WENT TO B-ROOM AT 0100- UNABLE TO VOID - UNTIL NOW  STILL TRYING TO VOID-.Marland Kitchen  SAYS  THIS HAPPENED 2 WEEKS AGO-  CAME HERE - CATH- WENT HOME.   IN THE OFFICE - HAS BEEN VOIDING OK SINCE THEN.    SAYS YESTERDAY  ALL  DAY  FELT PAIN IN LOWER ABD- BUT NOT CRAMPS-  AND  STILL FEELS THAT PAIN.  DID NOT FEEL AS MUCH  WITH FULL BLADDER.   ON ARRIVAL- TO RM 6-  CATH PT-  DRAINED 700 CC  CLEAR YELLOW URINE-  SENT UA.Marland Kitchen

## 2013-06-01 NOTE — MAU Provider Note (Signed)
  History     CSN: [id number]  Arrival date and time: 06/01/13 0606   None     No chief complaint on file.  HPI  Elizabeth King is  37 y.o. G1P0 at [redacted]w[redacted]d who presents today unable to urinate. She states that she last urinated at 2200 last night. She is also having lower abdominal pain. She has a prolapsed uterus.   Past Medical History  Diagnosis Date  . Uterine fibroid     Past Surgical History  Procedure Laterality Date  . No past surgeries      History reviewed. No pertinent family history.  History  Substance Use Topics  . Smoking status: Never Smoker   . Smokeless tobacco: Never Used  . Alcohol Use: No    Allergies:  Allergies  Allergen Reactions  . Latex Itching    Prescriptions prior to admission  Medication Sig Dispense Refill  . acetaminophen (TYLENOL) 325 MG tablet Take 650 mg by mouth every 6 (six) hours as needed for mild pain.      Marland Kitchen ondansetron (ZOFRAN) 8 MG tablet Take 8 mg by mouth every 8 (eight) hours as needed for nausea or vomiting.      . Prenatal Vit-Fe Fumarate-FA (PRENATAL MULTIVITAMIN) TABS tablet Take 1 tablet by mouth daily at 12 noon.      . pseudoephedrine-acetaminophen (TYLENOL SINUS) 30-500 MG TABS Take 1 tablet by mouth every 4 (four) hours as needed (for cold symptoms).         ROS Physical Exam   Blood pressure 116/74, pulse 62, temperature 98.3 F (36.8 C), temperature source Oral, resp. rate 18, height 5\' 4"  (1.626 m), weight 66.679 kg (147 lb), last menstrual period 09/20/2012, SpO2 100.00%.  Physical Exam  Nursing note and vitals reviewed. Constitutional: She is oriented to person, place, and time. She appears well-developed and well-nourished. No distress.  Cardiovascular: Normal rate.   Respiratory: Effort normal.  GI: Soft. There is no tenderness.  Neurological: She is alert and oriented to person, place, and time.  Skin: Skin is warm.  Psychiatric: She has a normal mood and affect.    MAU Course   Procedures  Results for orders placed during the hospital encounter of 06/01/13 (from the past 24 hour(s))  URINALYSIS, ROUTINE W REFLEX MICROSCOPIC     Status: Abnormal   Collection Time    06/01/13  6:30 AM      Result Value Range   Color, Urine YELLOW  YELLOW   APPearance HAZY (*) CLEAR   Specific Gravity, Urine 1.010  1.005 - 1.030   pH 7.0  5.0 - 8.0   Glucose, UA NEGATIVE  NEGATIVE mg/dL   Hgb urine dipstick NEGATIVE  NEGATIVE   Bilirubin Urine NEGATIVE  NEGATIVE   Ketones, ur NEGATIVE  NEGATIVE mg/dL   Protein, ur NEGATIVE  NEGATIVE mg/dL   Urobilinogen, UA 0.2  0.0 - 1.0 mg/dL   Nitrite NEGATIVE  NEGATIVE   Leukocytes, UA NEGATIVE  NEGATIVE   0735: C/W Dr. Renaldo Fiddler, patient is ok for dc home. Plan to have her set an alarm and try to urinate in the middle of the night before the bladder gets too full. Otherwise call for an appointment next week to consider pessary/foley/urology consult.   Assessment and Plan   1. Urinary retention    FU with the office next week Return to MAU if needed or urinary retention arises again Tawnya Crook 06/01/2013, 7:30 AM

## 2013-06-01 NOTE — MAU Note (Signed)
Pt states she has not been able to void since 1 am

## 2013-07-12 NOTE — L&D Delivery Note (Signed)
Delivery Note  SVD viable female Apgars 9,9 over 2nd degree ml lac and vaginal laceration .  Placenta delivered spontaneously intact with 3VC. Repair with 2-0 Chromic with good support and hemostasis noted and R/V exam confirms.  PH art was snet.  [place] cord blood was not done.  Mother and baby were doing well.  EBL 400cc  Louretta Shorten, MD

## 2013-11-13 LAB — OB RESULTS CONSOLE GBS: GBS: POSITIVE

## 2013-12-12 ENCOUNTER — Telehealth (HOSPITAL_COMMUNITY): Payer: Self-pay | Admitting: *Deleted

## 2013-12-12 ENCOUNTER — Encounter (HOSPITAL_COMMUNITY): Payer: Self-pay | Admitting: *Deleted

## 2013-12-12 NOTE — Telephone Encounter (Signed)
Preadmission screen  

## 2013-12-13 ENCOUNTER — Encounter (HOSPITAL_COMMUNITY): Payer: Self-pay | Admitting: *Deleted

## 2013-12-13 ENCOUNTER — Telehealth (HOSPITAL_COMMUNITY): Payer: Self-pay | Admitting: *Deleted

## 2013-12-13 NOTE — Telephone Encounter (Signed)
Preadmission screen  

## 2013-12-14 ENCOUNTER — Inpatient Hospital Stay (HOSPITAL_COMMUNITY)
Admission: RE | Admit: 2013-12-14 | Discharge: 2013-12-16 | DRG: 775 | Disposition: A | Discharge status: Home / Self Care | Payer: BC Managed Care – PPO | Source: Ambulatory Visit | Attending: Obstetrics and Gynecology | Admitting: Obstetrics and Gynecology

## 2013-12-14 ENCOUNTER — Encounter (HOSPITAL_COMMUNITY): Payer: Self-pay

## 2013-12-14 DIAGNOSIS — O99892 Other specified diseases and conditions complicating childbirth: Secondary | ICD-10-CM | POA: Diagnosis present

## 2013-12-14 DIAGNOSIS — Z833 Family history of diabetes mellitus: Secondary | ICD-10-CM

## 2013-12-14 DIAGNOSIS — O34599 Maternal care for other abnormalities of gravid uterus, unspecified trimester: Secondary | ICD-10-CM | POA: Diagnosis present

## 2013-12-14 DIAGNOSIS — D259 Leiomyoma of uterus, unspecified: Secondary | ICD-10-CM | POA: Diagnosis present

## 2013-12-14 DIAGNOSIS — O09519 Supervision of elderly primigravida, unspecified trimester: Secondary | ICD-10-CM | POA: Diagnosis present

## 2013-12-14 DIAGNOSIS — Z823 Family history of stroke: Secondary | ICD-10-CM

## 2013-12-14 DIAGNOSIS — O480 Post-term pregnancy: Principal | ICD-10-CM | POA: Diagnosis present

## 2013-12-14 DIAGNOSIS — D4959 Neoplasm of unspecified behavior of other genitourinary organ: Secondary | ICD-10-CM | POA: Diagnosis present

## 2013-12-14 DIAGNOSIS — O3410 Maternal care for benign tumor of corpus uteri, unspecified trimester: Secondary | ICD-10-CM

## 2013-12-14 DIAGNOSIS — O9989 Other specified diseases and conditions complicating pregnancy, childbirth and the puerperium: Secondary | ICD-10-CM

## 2013-12-14 DIAGNOSIS — Z22330 Carrier of Group B streptococcus: Secondary | ICD-10-CM

## 2013-12-14 LAB — CBC
HCT: 39.2 % (ref 36.0–46.0)
Hemoglobin: 13.2 g/dL (ref 12.0–15.0)
MCH: 28.9 pg (ref 26.0–34.0)
MCHC: 33.7 g/dL (ref 30.0–36.0)
MCV: 86 fL (ref 78.0–100.0)
Platelets: 243 10*3/uL (ref 150–400)
RBC: 4.56 MIL/uL (ref 3.87–5.11)
RDW: 14.2 % (ref 11.5–15.5)
WBC: 7.2 10*3/uL (ref 4.0–10.5)

## 2013-12-14 LAB — RPR

## 2013-12-14 MED ORDER — ONDANSETRON HCL 4 MG/2ML IJ SOLN: 4.0000 mg | Freq: Four times a day (QID) | INTRAMUSCULAR | Status: DC | PRN

## 2013-12-14 MED ORDER — DIPHENHYDRAMINE HCL 25 MG PO CAPS: 25.0000 mg | ORAL_CAPSULE | Freq: Four times a day (QID) | ORAL | Status: DC | PRN

## 2013-12-14 MED ORDER — LACTATED RINGERS IV SOLN: 500.0000 mL | INTRAVENOUS | Status: DC | PRN

## 2013-12-14 MED ORDER — PRENATAL MULTIVITAMIN CH
1.0000 | ORAL_TABLET | Freq: Every day | ORAL | Status: DC
Start: 2013-12-15 — End: 2013-12-16
  Administered 2013-12-15 – 2013-12-16 (×2): 1 via ORAL
  Filled 2013-12-14 (×2): qty 1

## 2013-12-14 MED ORDER — PHENYLEPHRINE 40 MCG/ML (10ML) SYRINGE FOR IV PUSH (FOR BLOOD PRESSURE SUPPORT)
80.0000 ug | PREFILLED_SYRINGE | INTRAVENOUS | Status: DC | PRN
  Filled 2013-12-14: qty 2

## 2013-12-14 MED ORDER — DIBUCAINE 1 % RE OINT
1.0000 | TOPICAL_OINTMENT | RECTAL | Status: DC | PRN
Start: 2013-12-14 — End: 2013-12-16
  Administered 2013-12-14: 1 via RECTAL
  Filled 2013-12-14: qty 28

## 2013-12-14 MED ORDER — ACETAMINOPHEN 325 MG PO TABS: 650.0000 mg | ORAL_TABLET | ORAL | Status: DC | PRN

## 2013-12-14 MED ORDER — LACTATED RINGERS IV SOLN: INTRAVENOUS | Status: DC

## 2013-12-14 MED ORDER — IBUPROFEN 600 MG PO TABS
600.0000 mg | ORAL_TABLET | Freq: Four times a day (QID) | ORAL | Status: DC | PRN
  Administered 2013-12-14: 600 mg via ORAL
  Filled 2013-12-14: qty 1

## 2013-12-14 MED ORDER — LACTATED RINGERS IV SOLN: 500.0000 mL | Freq: Once | INTRAVENOUS | Status: DC

## 2013-12-14 MED ORDER — SENNOSIDES-DOCUSATE SODIUM 8.6-50 MG PO TABS
2.0000 | ORAL_TABLET | ORAL | Status: DC
  Administered 2013-12-14 – 2013-12-16 (×2): 2 via ORAL
  Filled 2013-12-14 (×2): qty 2

## 2013-12-14 MED ORDER — OXYCODONE-ACETAMINOPHEN 5-325 MG PO TABS
1.0000 | ORAL_TABLET | ORAL | Status: DC | PRN
Start: 2013-12-14 — End: 2013-12-14

## 2013-12-14 MED ORDER — SODIUM CHLORIDE 0.9 % IV SOLN
2.0000 g | Freq: Four times a day (QID) | INTRAVENOUS | Status: DC
  Administered 2013-12-14 (×2): 2 g via INTRAVENOUS
  Filled 2013-12-14 (×4): qty 2000

## 2013-12-14 MED ORDER — MEASLES, MUMPS & RUBELLA VAC ~~LOC~~ INJ: 0.5000 mL | INJECTION | Freq: Once | SUBCUTANEOUS | Status: DC

## 2013-12-14 MED ORDER — EPHEDRINE 5 MG/ML INJ
10.0000 mg | INTRAVENOUS | Status: DC | PRN
  Filled 2013-12-14: qty 2

## 2013-12-14 MED ORDER — FENTANYL 2.5 MCG/ML BUPIVACAINE 1/10 % EPIDURAL INFUSION (WH - ANES): 14.0000 mL/h | INTRAMUSCULAR | Status: DC | PRN

## 2013-12-14 MED ORDER — TERBUTALINE SULFATE 1 MG/ML IJ SOLN: 0.2500 mg | Freq: Once | INTRAMUSCULAR | Status: DC | PRN

## 2013-12-14 MED ORDER — ZOLPIDEM TARTRATE 5 MG PO TABS: 5.0000 mg | ORAL_TABLET | Freq: Every evening | ORAL | Status: DC | PRN

## 2013-12-14 MED ORDER — BENZOCAINE-MENTHOL 20-0.5 % EX AERO
1.0000 "application " | INHALATION_SPRAY | CUTANEOUS | Status: DC | PRN
  Administered 2013-12-14 – 2013-12-16 (×2): 1 via TOPICAL
  Filled 2013-12-14 (×2): qty 56

## 2013-12-14 MED ORDER — LIDOCAINE HCL (PF) 1 % IJ SOLN
30.0000 mL | INTRAMUSCULAR | Status: AC | PRN
  Administered 2013-12-14: 30 mL via SUBCUTANEOUS
  Filled 2013-12-14: qty 30

## 2013-12-14 MED ORDER — WITCH HAZEL-GLYCERIN EX PADS
1.0000 "application " | MEDICATED_PAD | CUTANEOUS | Status: DC | PRN
  Administered 2013-12-14: 1 via TOPICAL

## 2013-12-14 MED ORDER — MEDROXYPROGESTERONE ACETATE 150 MG/ML IM SUSP: 150.0000 mg | INTRAMUSCULAR | Status: DC | PRN

## 2013-12-14 MED ORDER — OXYTOCIN BOLUS FROM INFUSION: 500.0000 mL | INTRAVENOUS | Status: DC

## 2013-12-14 MED ORDER — BUTORPHANOL TARTRATE 1 MG/ML IJ SOLN
INTRAMUSCULAR | Status: AC
  Filled 2013-12-14: qty 2

## 2013-12-14 MED ORDER — OXYCODONE-ACETAMINOPHEN 5-325 MG PO TABS: 1.0000 | ORAL_TABLET | ORAL | Status: DC | PRN

## 2013-12-14 MED ORDER — ONDANSETRON HCL 4 MG/2ML IJ SOLN: 4.0000 mg | INTRAMUSCULAR | Status: DC | PRN

## 2013-12-14 MED ORDER — SIMETHICONE 80 MG PO CHEW: 80.0000 mg | CHEWABLE_TABLET | ORAL | Status: DC | PRN

## 2013-12-14 MED ORDER — PHENYLEPH-SHARK LIV OIL-MO-PET 0.25-3-14-71.9 % RE OINT
1.0000 | TOPICAL_OINTMENT | Freq: Two times a day (BID) | RECTAL | Status: DC | PRN
Start: 2013-12-14 — End: 2013-12-14

## 2013-12-14 MED ORDER — IBUPROFEN 600 MG PO TABS
600.0000 mg | ORAL_TABLET | Freq: Four times a day (QID) | ORAL | Status: DC
  Administered 2013-12-14 – 2013-12-16 (×7): 600 mg via ORAL
  Filled 2013-12-14 (×7): qty 1

## 2013-12-14 MED ORDER — FLEET ENEMA 7-19 GM/118ML RE ENEM: 1.0000 | ENEMA | RECTAL | Status: DC | PRN

## 2013-12-14 MED ORDER — DIPHENHYDRAMINE HCL 50 MG/ML IJ SOLN: 12.5000 mg | INTRAMUSCULAR | Status: DC | PRN

## 2013-12-14 MED ORDER — BUTORPHANOL TARTRATE 1 MG/ML IJ SOLN
2.0000 mg | INTRAMUSCULAR | Status: DC | PRN
  Administered 2013-12-14: 2 mg via INTRAVENOUS

## 2013-12-14 MED ORDER — OXYTOCIN 40 UNITS IN LACTATED RINGERS INFUSION - SIMPLE MED
1.0000 m[IU]/min | INTRAVENOUS | Status: DC
  Filled 2013-12-14: qty 1000

## 2013-12-14 MED ORDER — CITRIC ACID-SODIUM CITRATE 334-500 MG/5ML PO SOLN
30.0000 mL | ORAL | Status: DC | PRN
  Administered 2013-12-14: 30 mL via ORAL
  Filled 2013-12-14: qty 15

## 2013-12-14 MED ORDER — LANOLIN HYDROUS EX OINT: TOPICAL_OINTMENT | CUTANEOUS | Status: DC | PRN

## 2013-12-14 MED ORDER — OXYTOCIN 40 UNITS IN LACTATED RINGERS INFUSION - SIMPLE MED
62.5000 mL/h | INTRAVENOUS | Status: DC
  Administered 2013-12-14: 999 mL/h via INTRAVENOUS

## 2013-12-14 MED ORDER — ONDANSETRON HCL 4 MG PO TABS: 4.0000 mg | ORAL_TABLET | ORAL | Status: DC | PRN

## 2013-12-14 MED ORDER — TETANUS-DIPHTH-ACELL PERTUSSIS 5-2.5-18.5 LF-MCG/0.5 IM SUSP
0.5000 mL | Freq: Once | INTRAMUSCULAR | Status: AC
  Administered 2013-12-15: 0.5 mL via INTRAMUSCULAR
  Filled 2013-12-14: qty 0.5

## 2013-12-14 NOTE — Progress Notes (Signed)
Delivery of live viable female by Dr Corinna Capra. APGARS 9,9

## 2013-12-14 NOTE — Progress Notes (Signed)
Dr Corinna Capra notified of FHR tracing baseline, UCs, and admission status. Orders provided for admission and ABX

## 2013-12-14 NOTE — H&P (Signed)
Elizabeth King is a 38 y.o. female presenting for IOL due to postdates and favorable cervix.  Preg complicated by AMA with normal NIPT.  GBS+ and large fibroids. History OB History   Grav Para Term Preterm Abortions TAB SAB Ect Mult Living   1              Past Medical History  Diagnosis Date  . Uterine fibroid   . AMA (advanced maternal age) multigravida 83+   . Vaginal Pap smear, abnormal   . Anemia    Past Surgical History  Procedure Laterality Date  . No past surgeries    . Combined hysteroscopy diagnostic / d&c     Family History: family history includes Cancer in her maternal grandmother and maternal uncle; Diabetes in her maternal grandfather; Heart disease in her mother; Stroke in her maternal grandfather. Social History:  reports that she has never smoked. She has never used smokeless tobacco. She reports that she does not drink alcohol or use illicit drugs.   Prenatal Transfer Tool  Maternal Diabetes: No Genetic Screening: Normal Maternal Ultrasounds/Referrals: Normal Fetal Ultrasounds or other Referrals:  None Maternal Substance Abuse:  No Significant Maternal Medications:  None Significant Maternal Lab Results:  None Other Comments:  None  ROS  Dilation: 2.5 Effacement (%): 80 Station: -2 Exam by:: Elizabeth King Blood pressure 123/81, pulse 65, temperature 98.3 F (36.8 C), temperature source Oral, resp. rate 16, last menstrual period 09/20/2012. Exam Physical Exam  Prenatal labs: ABO, Rh: B/Positive/-- (10/22 0000) Antibody: Negative (10/22 0000) Rubella: Immune (10/22 0000) RPR: Nonreactive (10/22 0000)  HBsAg: Negative (10/22 0000)  HIV: Non-reactive (10/22 0000)  GBS: Positive (05/05 0000)   Assessment/Plan: IUP at 40+ weeks AROM  Pitocin prn Abx for GBS Anticipate SVD   Elizabeth King 12/14/2013, 10:25 AM

## 2013-12-15 LAB — CBC
HCT: 32.9 % — ABNORMAL LOW (ref 36.0–46.0)
Hemoglobin: 11.1 g/dL — ABNORMAL LOW (ref 12.0–15.0)
MCH: 28.8 pg (ref 26.0–34.0)
MCHC: 33.7 g/dL (ref 30.0–36.0)
MCV: 85.5 fL (ref 78.0–100.0)
Platelets: 195 10*3/uL (ref 150–400)
RBC: 3.85 MIL/uL — ABNORMAL LOW (ref 3.87–5.11)
RDW: 14.2 % (ref 11.5–15.5)
WBC: 12.9 10*3/uL — ABNORMAL HIGH (ref 4.0–10.5)

## 2013-12-15 NOTE — Progress Notes (Signed)
Post Partum Day 1 Subjective: no complaints  Objective: Blood pressure 133/95, pulse 75, temperature 97.4 F (36.3 C), temperature source Oral, resp. rate 17, last menstrual period 09/20/2012, SpO2 97.00%, unknown if currently breastfeeding.  Physical Exam:  General: alert, cooperative and no distress Lochia: appropriate Uterine Fundus: firm Incision:  DVT Evaluation: No evidence of DVT seen on physical exam.   Recent Labs  12/14/13 0745 12/15/13 0633  HGB 13.2 11.1*  HCT 39.2 32.9*    Assessment/Plan: Plan for discharge tomorrow and Breastfeeding   LOS: 1 day   Elizabeth King 12/15/2013, 9:42 AM

## 2013-12-15 NOTE — Lactation Note (Addendum)
This note was copied from the chart of Elizabeth Roena Sassaman. Lactation Consultation Note: Initial visit with mom. She reports that baby has been nursing well but she is having some trouble getting the baby latched to the breast when she is open wide. Reviewed football hold, plenty of pillows and waiting for wide open mouth. Baby asleep in bassinet at present. Asking about pumping- questions answered. Plans to get pump from insurance company. No further questions at present,. BF brochure given with resources for support after DC To call prn  Patient Name: Elizabeth King [id number] Date: 12/15/2013 Reason for consult: Initial assessment   Maternal Data Formula Feeding for Exclusion: No Infant to breast within first hour of birth: Yes Does the patient have breastfeeding experience prior to this delivery?: No  Feeding   LATCH Score/Interventions  Lactation Tools Discussed/Used     Consult Status Consult Status: Follow-up Date: 12/16/13 Follow-up type: In-patient    Truddie Crumble 12/15/2013, 11:35 AM

## 2013-12-16 MED ORDER — OXYCODONE-ACETAMINOPHEN 5-325 MG PO TABS: 1.0000 | ORAL_TABLET | ORAL | Status: DC | PRN

## 2013-12-16 MED ORDER — IBUPROFEN 600 MG PO TABS: 600.0000 mg | ORAL_TABLET | Freq: Four times a day (QID) | ORAL | Status: DC

## 2013-12-16 NOTE — Lactation Note (Signed)
This note was copied from the chart of Elizabeth King. Lactation Consultation Note  Patient Name: Elizabeth King [id number] Date: 12/16/2013 Reason for consult: Follow-up assessment Per mom , the baby has been cluster feeding, LC reviewed basics- prior to latch - breast massage , hand express, prepump if needed  Latch with breast compressions until the baby is in a consist swallowing pattern , and then intermittent. Sore nipple and engorgement prevention and tx reviewed, mom already has a hand pump. Mom had many breast feeding questions , LC reviewed and answered them.  Mother informed of post-discharge support and given phone number to the lactation department, including services for phone call assistance; out-patient appointments; and breastfeeding support group. List of other breastfeeding resources in the community given in the handout. Encouraged mother to call for problems or concerns related to breastfeeding.   Maternal Data Has patient been taught Hand Expression?: Yes  Feeding Feeding Type:  (per mom baby has been cluster feeding )  LATCH Score/Interventions                Intervention(s): Breastfeeding basics reviewed (see LC note )     Lactation Tools Discussed/Used Tools: Pump (per mom MBURN gave her the pump and instructed ) Breast pump type: Manual Pump Review:  (mom aware of both setup , cleaning and storage )   Consult Status Consult Status: Complete    Elizabeth King 12/16/2013, 1:22 PM

## 2013-12-19 ENCOUNTER — Ambulatory Visit (HOSPITAL_COMMUNITY): Admission: RE | Admit: 2013-12-19 | Payer: BC Managed Care – PPO | Source: Ambulatory Visit

## 2013-12-19 NOTE — Discharge Summary (Signed)
Obstetric Discharge Summary Reason for Admission: induction of labor Prenatal Procedures: ultrasound Intrapartum Procedures: spontaneous vaginal delivery Postpartum Procedures: none Complications-Operative and Postpartum: 2 degree perineal laceration Hemoglobin  Date Value Ref Range Status  12/15/2013 11.1* 12.0 - 15.0 g/dL Final     HCT  Date Value Ref Range Status  12/15/2013 32.9* 36.0 - 46.0 % Final    Physical Exam:  General: alert and cooperative Lochia: appropriate Uterine Fundus: firm Incision: healing well DVT Evaluation: No evidence of DVT seen on physical exam. Negative Homan's sign. No cords or calf tenderness.  Discharge Diagnoses: Term Pregnancy-delivered  Discharge Information: Date: 12/19/2013 Activity: pelvic rest Diet: routine Medications: PNV and Ibuprofen Condition: stable Instructions: refer to practice specific booklet Discharge to: home   Newborn Data: Live born female  Birth Weight: 7 lb 9 oz (3430 g) APGAR: 9, 9  Home with mother.  Elizabeth King G 12/19/2013, 8:52 AM

## 2014-05-13 ENCOUNTER — Encounter (HOSPITAL_COMMUNITY): Payer: Self-pay

## 2016-07-02 ENCOUNTER — Other Ambulatory Visit: Payer: Self-pay | Admitting: Family Medicine

## 2016-07-02 ENCOUNTER — Ambulatory Visit
Admission: RE | Admit: 2016-07-02 | Discharge: 2016-07-02 | Disposition: A | Discharge status: Home / Self Care | Payer: BLUE CROSS/BLUE SHIELD | Source: Ambulatory Visit | Attending: Family Medicine | Admitting: Family Medicine

## 2016-07-02 DIAGNOSIS — R059 Cough, unspecified: Secondary | ICD-10-CM

## 2016-07-02 DIAGNOSIS — R05 Cough: Secondary | ICD-10-CM

## 2017-08-25 DIAGNOSIS — J111 Influenza due to unidentified influenza virus with other respiratory manifestations: Secondary | ICD-10-CM | POA: Diagnosis not present

## 2017-08-25 DIAGNOSIS — R509 Fever, unspecified: Secondary | ICD-10-CM | POA: Diagnosis not present

## 2017-09-29 DIAGNOSIS — Z136 Encounter for screening for cardiovascular disorders: Secondary | ICD-10-CM | POA: Diagnosis not present

## 2017-09-29 DIAGNOSIS — Z0000 Encounter for general adult medical examination without abnormal findings: Secondary | ICD-10-CM | POA: Diagnosis not present

## 2017-09-29 DIAGNOSIS — Z131 Encounter for screening for diabetes mellitus: Secondary | ICD-10-CM | POA: Diagnosis not present

## 2017-10-13 DIAGNOSIS — R5383 Other fatigue: Secondary | ICD-10-CM | POA: Diagnosis not present

## 2017-10-13 DIAGNOSIS — Z01419 Encounter for gynecological examination (general) (routine) without abnormal findings: Secondary | ICD-10-CM | POA: Diagnosis not present

## 2017-10-13 DIAGNOSIS — Z6828 Body mass index (BMI) 28.0-28.9, adult: Secondary | ICD-10-CM | POA: Diagnosis not present

## 2017-10-13 DIAGNOSIS — R635 Abnormal weight gain: Secondary | ICD-10-CM | POA: Diagnosis not present

## 2017-10-13 DIAGNOSIS — Z1231 Encounter for screening mammogram for malignant neoplasm of breast: Secondary | ICD-10-CM | POA: Diagnosis not present

## 2017-10-21 DIAGNOSIS — J111 Influenza due to unidentified influenza virus with other respiratory manifestations: Secondary | ICD-10-CM | POA: Diagnosis not present

## 2017-10-21 DIAGNOSIS — M791 Myalgia, unspecified site: Secondary | ICD-10-CM | POA: Diagnosis not present

## 2017-11-22 DIAGNOSIS — L309 Dermatitis, unspecified: Secondary | ICD-10-CM | POA: Diagnosis not present

## 2018-10-06 DIAGNOSIS — K529 Noninfective gastroenteritis and colitis, unspecified: Secondary | ICD-10-CM | POA: Diagnosis not present

## 2018-11-20 DIAGNOSIS — Z20828 Contact with and (suspected) exposure to other viral communicable diseases: Secondary | ICD-10-CM | POA: Diagnosis not present

## 2018-11-22 DIAGNOSIS — F411 Generalized anxiety disorder: Secondary | ICD-10-CM | POA: Diagnosis not present

## 2018-11-27 DIAGNOSIS — F411 Generalized anxiety disorder: Secondary | ICD-10-CM | POA: Diagnosis not present

## 2018-12-07 DIAGNOSIS — F3289 Other specified depressive episodes: Secondary | ICD-10-CM | POA: Diagnosis not present

## 2018-12-07 DIAGNOSIS — F411 Generalized anxiety disorder: Secondary | ICD-10-CM | POA: Diagnosis not present

## 2018-12-21 DIAGNOSIS — F411 Generalized anxiety disorder: Secondary | ICD-10-CM | POA: Diagnosis not present

## 2019-01-04 DIAGNOSIS — Z6826 Body mass index (BMI) 26.0-26.9, adult: Secondary | ICD-10-CM | POA: Diagnosis not present

## 2019-01-04 DIAGNOSIS — Z1231 Encounter for screening mammogram for malignant neoplasm of breast: Secondary | ICD-10-CM | POA: Diagnosis not present

## 2019-01-04 DIAGNOSIS — Z01419 Encounter for gynecological examination (general) (routine) without abnormal findings: Secondary | ICD-10-CM | POA: Diagnosis not present

## 2019-05-21 ENCOUNTER — Other Ambulatory Visit: Payer: Self-pay

## 2019-05-21 DIAGNOSIS — Z20822 Contact with and (suspected) exposure to covid-19: Secondary | ICD-10-CM

## 2019-05-22 LAB — NOVEL CORONAVIRUS, NAA: SARS-CoV-2, NAA: NOT DETECTED

## 2020-05-06 DIAGNOSIS — B029 Zoster without complications: Secondary | ICD-10-CM | POA: Diagnosis not present

## 2020-07-08 ENCOUNTER — Encounter (HOSPITAL_COMMUNITY): Payer: Self-pay

## 2020-07-08 ENCOUNTER — Emergency Department (HOSPITAL_COMMUNITY)
Admission: EM | Admit: 2020-07-08 | Discharge: 2020-07-08 | Disposition: A | Discharge status: Home / Self Care | Payer: BC Managed Care – PPO | Attending: Emergency Medicine | Admitting: Emergency Medicine

## 2020-07-08 DIAGNOSIS — R338 Other retention of urine: Secondary | ICD-10-CM | POA: Diagnosis not present

## 2020-07-08 DIAGNOSIS — R339 Retention of urine, unspecified: Secondary | ICD-10-CM | POA: Diagnosis not present

## 2020-07-08 DIAGNOSIS — Z91040 Latex allergy status: Secondary | ICD-10-CM | POA: Insufficient documentation

## 2020-07-08 LAB — URINALYSIS, ROUTINE W REFLEX MICROSCOPIC
Bilirubin Urine: NEGATIVE
Glucose, UA: NEGATIVE mg/dL
Hgb urine dipstick: NEGATIVE
Ketones, ur: 5 mg/dL — AB
Leukocytes,Ua: NEGATIVE
Nitrite: NEGATIVE
Protein, ur: NEGATIVE mg/dL
Specific Gravity, Urine: 1.009 (ref 1.005–1.030)
pH: 6 (ref 5.0–8.0)

## 2020-07-08 NOTE — ED Triage Notes (Signed)
Pt reports that she is unable to urinate since yesterday evening, had this problem twice in the past when pregnant. Denies hematuria or dysuria prior. Pt very uncomfortable

## 2020-07-08 NOTE — ED Notes (Signed)
Unable to bladder scan pt due to battery being dead; pt abd distended, very uncomfortable

## 2020-07-08 NOTE — ED Provider Notes (Signed)
[facility] EMERGENCY DEPARTMENT Provider Note   CSN: [id number] Arrival date & time: 07/08/20  0554   History Chief Complaint  Patient presents with  . Urinary Retention    Elizabeth King is a 44 y.o. female.  The history is provided by the patient.  She has history of uterine fibroids and comes in because of inability to urinate.  She was last able to urinate yesterday afternoon.  She states that she had 2 episodes of urinary retention during pregnancy which required catheterization.  That was over 6 years ago.  Over the last several weeks, she has had several episodes where she initially was not able to urinate but was finally able to empty her bladder.  These always occurred at night.  She denies any dysuria denies any abdominal pain or flank pain.  Past Medical History:  Diagnosis Date  . AMA (advanced maternal age) multigravida 74+   . Anemia   . Uterine fibroid   . Vaginal Pap smear, abnormal     Patient Active Problem List   Diagnosis Date Noted  . Other specified indication for care or intervention related to labor and delivery, unspecified as to episode of care 12/14/2013  . PNEUMONIA ORGANISM NOS 12/28/2007    Past Surgical History:  Procedure Laterality Date  . COMBINED HYSTEROSCOPY DIAGNOSTIC / D&C    . NO PAST SURGERIES       OB History    Gravida  1   Para  1   Term  1   Preterm      AB      Living  1     SAB      IAB      Ectopic      Multiple      Live Births  1           Family History  Problem Relation Age of Onset  . Heart disease Mother   . Cancer Maternal Uncle   . Cancer Maternal Grandmother   . Diabetes Maternal Grandfather   . Stroke Maternal Grandfather     Social History   Tobacco Use  . Smoking status: Never Smoker  . Smokeless tobacco: Never Used  Substance Use Topics  . Alcohol use: No  . Drug use: No    Home Medications Prior to Admission medications   Medication Sig Start Date  End Date Taking? Authorizing Provider  ibuprofen (ADVIL) 200 MG tablet Take 400 mg by mouth every 6 (six) hours as needed for headache or moderate pain.   Yes [provider]  Multiple Vitamin (MULTI-DAY PO) Take 1 tablet by mouth daily.   Yes [provider]  norethindrone-ethinyl estradiol (BALZIVA) 0.4-35 MG-MCG tablet Take 1 tablet by mouth daily.   Yes [provider]    Allergies    Latex  Review of Systems   Review of Systems  All other systems reviewed and are negative.   Physical Exam Updated Vital Signs BP (!) 137/97 (BP Location: Left Arm)   Pulse 85   Temp 98.5 F (36.9 C) (Oral)   Resp 19   LMP 07/01/2020   SpO2 100%   Physical Exam Vitals and nursing note reviewed.   44 year old female, resting comfortably and in no acute distress. Vital signs are significant for elevated blood pressure. Oxygen saturation is 100%, which is normal. Head is normocephalic and atraumatic. PERRLA, EOMI. Oropharynx is clear. Neck is nontender and supple without adenopathy or JVD. Back is  nontender and there is no CVA tenderness. Lungs are clear without rales, wheezes, or rhonchi. Chest is nontender. Heart has regular rate and rhythm without murmur. Abdomen is soft, flat, nontender without masses or hepatosplenomegaly and peristalsis is normoactive. (She already had Foley catheter inserted by the time I saw her, so bladder was decompressed). Extremities have no cyanosis or edema, full range of motion is present. Skin is warm and dry without rash. Neurologic: Mental status is normal, cranial nerves are intact, there are no motor or sensory deficits.  ED Results / Procedures / Treatments   Labs (all labs ordered are listed, but only abnormal results are displayed) Labs Reviewed  URINALYSIS, ROUTINE W REFLEX MICROSCOPIC - Abnormal; Notable for the following components:      Result Value   Ketones, ur 5 (*)    All other components within normal limits   URINE CULTURE   Procedures Procedures  Medications Ordered in ED Medications - No data to display  ED Course  I have reviewed the triage vital signs and the nursing notes.  Pertinent labs & imaging results that were available during my care of the patient were reviewed by me and considered in my medical decision making (see chart for details).  MDM Rules/Calculators/A&P Acute urinary retention.  Foley catheter was inserted and is draining clear urine and patient feels much better.  Old records are reviewed confirming 2 episodes of urinary retention during pregnancy in 2014.  Will check urinalysis to make sure there is no evidence of infection.  Anticipate discontinuing Foley catheter and referring her to urology for outpatient evaluation.  Urinalysis is normal except for small amount of ketones.  Foley catheter is removed and she is discharged with referral to urology.  Final Clinical Impression(s) / ED Diagnoses Final diagnoses:  Acute urinary retention    Rx / DC Orders ED Discharge Orders    None       Dione Booze, MD 07/08/20 [phone]

## 2020-07-10 LAB — URINE CULTURE: Culture: >=100000 — AB

## 2020-07-11 ENCOUNTER — Telehealth: Payer: Self-pay

## 2020-07-11 NOTE — Telephone Encounter (Signed)
No treatment for UC ED 07/08/20 per Lucius Conn PA

## 2020-09-19 DIAGNOSIS — R3914 Feeling of incomplete bladder emptying: Secondary | ICD-10-CM | POA: Diagnosis not present

## 2020-09-19 DIAGNOSIS — R338 Other retention of urine: Secondary | ICD-10-CM | POA: Diagnosis not present

## 2020-09-23 ENCOUNTER — Other Ambulatory Visit: Payer: Self-pay | Admitting: Urology

## 2020-09-23 DIAGNOSIS — R339 Retention of urine, unspecified: Secondary | ICD-10-CM

## 2020-09-23 DIAGNOSIS — D219 Benign neoplasm of connective and other soft tissue, unspecified: Secondary | ICD-10-CM

## 2020-10-01 ENCOUNTER — Ambulatory Visit
Admission: RE | Admit: 2020-10-01 | Discharge: 2020-10-01 | Disposition: A | Discharge status: Home / Self Care | Payer: BC Managed Care – PPO | Source: Ambulatory Visit | Attending: Urology | Admitting: Urology

## 2020-10-01 ENCOUNTER — Other Ambulatory Visit: Payer: BC Managed Care – PPO

## 2020-10-01 ENCOUNTER — Other Ambulatory Visit: Payer: Self-pay

## 2020-10-01 DIAGNOSIS — D219 Benign neoplasm of connective and other soft tissue, unspecified: Secondary | ICD-10-CM

## 2020-10-01 DIAGNOSIS — D252 Subserosal leiomyoma of uterus: Secondary | ICD-10-CM | POA: Diagnosis not present

## 2020-10-01 DIAGNOSIS — D251 Intramural leiomyoma of uterus: Secondary | ICD-10-CM | POA: Diagnosis not present

## 2020-10-14 DIAGNOSIS — D259 Leiomyoma of uterus, unspecified: Secondary | ICD-10-CM | POA: Diagnosis not present

## 2020-10-14 DIAGNOSIS — R339 Retention of urine, unspecified: Secondary | ICD-10-CM | POA: Diagnosis not present

## 2020-10-14 DIAGNOSIS — R102 Pelvic and perineal pain: Secondary | ICD-10-CM | POA: Diagnosis not present

## 2020-10-28 DIAGNOSIS — R338 Other retention of urine: Secondary | ICD-10-CM | POA: Diagnosis not present

## 2020-10-31 ENCOUNTER — Other Ambulatory Visit: Payer: Self-pay | Admitting: Urology

## 2020-10-31 DIAGNOSIS — R338 Other retention of urine: Secondary | ICD-10-CM | POA: Diagnosis not present

## 2020-10-31 DIAGNOSIS — N3582 Other urethral stricture, female: Secondary | ICD-10-CM | POA: Diagnosis not present

## 2020-10-31 DIAGNOSIS — R3914 Feeling of incomplete bladder emptying: Secondary | ICD-10-CM | POA: Diagnosis not present

## 2020-12-12 ENCOUNTER — Encounter (HOSPITAL_BASED_OUTPATIENT_CLINIC_OR_DEPARTMENT_OTHER): Admission: RE | Payer: Self-pay | Source: Home / Self Care

## 2020-12-12 ENCOUNTER — Ambulatory Visit (HOSPITAL_BASED_OUTPATIENT_CLINIC_OR_DEPARTMENT_OTHER): Admission: RE | Admit: 2020-12-12 | Payer: BC Managed Care – PPO | Source: Home / Self Care | Admitting: Urology

## 2020-12-12 SURGERY — CYSTOSCOPY, WITH URETHRAL DILATION
Anesthesia: General

## 2021-03-06 DIAGNOSIS — Z1231 Encounter for screening mammogram for malignant neoplasm of breast: Secondary | ICD-10-CM | POA: Diagnosis not present

## 2021-03-06 DIAGNOSIS — Z6828 Body mass index (BMI) 28.0-28.9, adult: Secondary | ICD-10-CM | POA: Diagnosis not present

## 2021-03-06 DIAGNOSIS — R5383 Other fatigue: Secondary | ICD-10-CM | POA: Diagnosis not present

## 2021-03-06 DIAGNOSIS — Z01419 Encounter for gynecological examination (general) (routine) without abnormal findings: Secondary | ICD-10-CM | POA: Diagnosis not present

## 2021-03-20 DIAGNOSIS — R102 Pelvic and perineal pain: Secondary | ICD-10-CM | POA: Diagnosis not present

## 2021-03-20 DIAGNOSIS — D251 Intramural leiomyoma of uterus: Secondary | ICD-10-CM | POA: Diagnosis not present

## 2021-03-20 DIAGNOSIS — D252 Subserosal leiomyoma of uterus: Secondary | ICD-10-CM | POA: Diagnosis not present

## 2021-04-02 DIAGNOSIS — J208 Acute bronchitis due to other specified organisms: Secondary | ICD-10-CM | POA: Diagnosis not present

## 2021-04-02 DIAGNOSIS — J040 Acute laryngitis: Secondary | ICD-10-CM | POA: Diagnosis not present

## 2021-04-03 DIAGNOSIS — Z20822 Contact with and (suspected) exposure to covid-19: Secondary | ICD-10-CM | POA: Diagnosis not present

## 2022-04-06 DIAGNOSIS — Z6830 Body mass index (BMI) 30.0-30.9, adult: Secondary | ICD-10-CM | POA: Diagnosis not present

## 2022-04-06 DIAGNOSIS — Z1231 Encounter for screening mammogram for malignant neoplasm of breast: Secondary | ICD-10-CM | POA: Diagnosis not present

## 2022-04-06 DIAGNOSIS — Z124 Encounter for screening for malignant neoplasm of cervix: Secondary | ICD-10-CM | POA: Diagnosis not present

## 2022-04-06 DIAGNOSIS — Z01419 Encounter for gynecological examination (general) (routine) without abnormal findings: Secondary | ICD-10-CM | POA: Diagnosis not present

## 2022-08-06 ENCOUNTER — Encounter (HOSPITAL_BASED_OUTPATIENT_CLINIC_OR_DEPARTMENT_OTHER): Payer: Self-pay | Admitting: Obstetrics and Gynecology

## 2022-08-06 ENCOUNTER — Other Ambulatory Visit: Payer: Self-pay

## 2022-08-06 NOTE — Progress Notes (Signed)
Spoke w/ via phone for pre-op interview---Addie Lab needs dos---- urine pregnancy per anesthesia, surgeon orders pending as of 08/06/22             Lab results------08/10/22 lab appt for cbc, type & screen COVID test -----patient states asymptomatic no test needed Arrive at -------0530 on Monday, 08/16/22 NPO after MN NO Solid Food.  Clear liquids from MN until---0430 Med rec completed Medications to take morning of surgery -----Norethindrone Diabetic medication -----n/a Patient instructed no nail polish to be worn day of surgery Patient instructed to bring photo id and insurance card day of surgery Patient aware to have Driver (ride ) / caregiver    for 24 hours after surgery - unsure of driver/ caregiver/ Patient is aware that she must have a driver and caregiver. Patient Special Instructions -----Extended / overnight stay instructions given. Pre-Op special Istructions -----Requested orders from Dr. Corinna Capra via Epic IB on 08/06/22. Patient verbalized understanding of instructions that were given at this phone interview. Patient denies shortness of breath, chest pain, fever, cough at this phone interview.

## 2022-08-06 NOTE — Progress Notes (Signed)
Your procedure is scheduled on Monday, 08/16/22.  Report to [facility]   Call this number if you have problems the morning of surgery  [phone].   OUR ADDRESS IS [address].  WE ARE LOCATED IN [facility].  PLEASE BRING YOUR INSURANCE CARD AND PHOTO ID DAY OF SURGERY.  ONLY 2 PEOPLE ARE ALLOWED IN  WAITING  ROOM.                                      REMEMBER:  DO NOT EAT FOOD, CANDY GUM OR MINTS  AFTER MIDNIGHT THE NIGHT BEFORE YOUR SURGERY . YOU MAY HAVE CLEAR LIQUIDS FROM MIDNIGHT THE NIGHT BEFORE YOUR SURGERY UNTIL  4:30 AM. NO CLEAR LIQUIDS AFTER  4:30 AM DAY OF SURGERY.  YOU MAY  BRUSH YOUR TEETH MORNING OF SURGERY AND RINSE YOUR MOUTH OUT, NO CHEWING GUM CANDY OR MINTS.     CLEAR LIQUID DIET   Foods Allowed                                                                     Foods Excluded  Coffee and tea, regular and decaf                             liquids that you cannot  Plain Jell-O                                                                   see through such as: Fruit ices (not with fruit pulp)                                     milk, soups, orange juice  Plain  Popsicles                                    All solid food Carbonated beverages, regular and diet                                    Cranberry, grape and apple juices Sports drinks like Gatorade _____________________________________________________________________     TAKE ONLY THESE MEDICATIONS MORNING OF SURGERY: Norethindrone    UP TO 4 VISITORS  MAY VISIT IN THE EXTENDED RECOVERY ROOM UNTIL 800 PM ONLY.  ONE  VISITOR AGE 46 AND OVER MAY SPEND THE NIGHT AND MUST BE IN EXTENDED RECOVERY ROOM NO LATER THAN 800 PM . YOUR DISCHARGE TIME AFTER YOU SPEND THE NIGHT IS 900 AM THE MORNING AFTER YOUR SURGERY.  YOU MAY PACK A SMALL OVERNIGHT BAG WITH TOILETRIES FOR YOUR OVERNIGHT STAY IF  YOU WISH.  YOUR PRESCRIPTION MEDICATIONS WILL BE  PROVIDED DURING [place].                                      DO NOT WEAR JEWERLY, MAKE UP. DO NOT WEAR LOTIONS, POWDERS, PERFUMES OR NAIL POLISH ON YOUR FINGERNAILS. TOENAIL POLISH IS OK TO WEAR. DO NOT SHAVE FOR 48 HOURS PRIOR TO DAY OF SURGERY. MEN MAY SHAVE FACE AND NECK. CONTACTS, GLASSES, OR DENTURES MAY NOT BE WORN TO SURGERY.  REMEMBER: NO SMOKING, DRUGS OR ALCOHOL FOR 24 HOURS BEFORE YOUR SURGERY.                                    [facility] IS NOT RESPONSIBLE  FOR ANY BELONGINGS.                                                                    Marland Kitchen            - Preparing for Surgery Before surgery, you can play an important role.  Because skin is not sterile, your skin needs to be as free of germs as possible.  You can reduce the number of germs on your skin by washing with CHG (chlorahexidine gluconate) soap before surgery.  CHG is an antiseptic cleaner which kills germs and bonds with the skin to continue killing germs even after washing. Please DO NOT use if you have an allergy to CHG or antibacterial soaps.  If your skin becomes reddened/irritated stop using the CHG and inform your nurse when you arrive at Short Stay. Do not shave (including legs and underarms) for at least 48 hours prior to the first CHG shower.  You may shave your face/neck. Please follow these instructions carefully:  1.  Shower with CHG Soap the night before surgery and the  morning of Surgery.  2.  If you choose to wash your hair, wash your hair first as usual with your  normal  shampoo.  3.  After you shampoo, rinse your hair and body thoroughly to remove the  shampoo.                                        4.  Use CHG as you would any other liquid soap.  You can apply chg directly  to the skin and wash , chg soap provided, night before and morning of your surgery.  5.  Apply the CHG Soap to your body ONLY FROM THE NECK DOWN.   Do not use on face/ open                           Wound  or open sores. Avoid contact with eyes, ears mouth and genitals (private parts).                       Wash face,  Genitals (private parts) with your normal soap.  6.  Wash thoroughly, paying special attention to the area where your surgery  will be performed.  7.  Thoroughly rinse your body with warm water from the neck down.  8.  DO NOT shower/wash with your normal soap after using and rinsing off  the CHG Soap.             9.  Pat yourself dry with a clean towel.            10.  Wear clean pajamas.            11.  Place clean sheets on your bed the night of your first shower and do not  sleep with pets. Day of Surgery : Do not apply any lotions/deodorants the morning of surgery.  Please wear clean clothes to the hospital/surgery center.  IF YOU HAVE ANY SKIN IRRITATION OR PROBLEMS WITH THE SURGICAL SOAP, PLEASE GET A BAR OF GOLD DIAL SOAP AND SHOWER THE NIGHT BEFORE YOUR SURGERY AND THE MORNING OF YOUR SURGERY. PLEASE LET THE NURSE KNOW MORNING OF YOUR SURGERY IF YOU HAD ANY PROBLEMS WITH THE SURGICAL SOAP.   ________________________________________________________________________                                                        QUESTIONS [facility] PRE OP NURSE PHONE [phone].

## 2022-08-10 ENCOUNTER — Encounter (HOSPITAL_COMMUNITY)
Admission: RE | Admit: 2022-08-10 | Discharge: 2022-08-10 | Disposition: A | Discharge status: Home / Self Care | Payer: 59 | Source: Ambulatory Visit | Attending: Obstetrics and Gynecology | Admitting: Obstetrics and Gynecology

## 2022-08-10 DIAGNOSIS — Z01812 Encounter for preprocedural laboratory examination: Secondary | ICD-10-CM | POA: Diagnosis not present

## 2022-08-10 DIAGNOSIS — Z01818 Encounter for other preprocedural examination: Secondary | ICD-10-CM

## 2022-08-10 LAB — CBC
HCT: 42.7 % (ref 36.0–46.0)
Hemoglobin: 13.6 g/dL (ref 12.0–15.0)
MCH: 28.2 pg (ref 26.0–34.0)
MCHC: 31.9 g/dL (ref 30.0–36.0)
MCV: 88.6 fL (ref 80.0–100.0)
Platelets: 368 10*3/uL (ref 150–400)
RBC: 4.82 MIL/uL (ref 3.87–5.11)
RDW: 13.9 % (ref 11.5–15.5)
WBC: 5.6 10*3/uL (ref 4.0–10.5)
nRBC: 0 % (ref 0.0–0.2)

## 2022-08-13 MED ORDER — CEFAZOLIN SODIUM-DEXTROSE 2-4 GM/100ML-% IV SOLN: 2.0000 g | INTRAVENOUS | Status: DC

## 2022-08-15 NOTE — H&P (Signed)
Elizabeth King presents with 14 weeks size fibroids, pelvic pain, history of urinary retention due to mechanical obstruction due to fibroids and menorrhagia She desires hysterectomy and presents for this. Patient's Care Team Primary Care Provider: [facility]: [address], [place], [place] [address], Ph [phone], Fax [phone] Patient's Pharmacies [facility] [id number] [facility]): [address] [place], [place], [place] [address], Ph [phone], Fax [phone] Vitals Wt: 181 lbs 08/09/2022 01:51 pm Ht: 5 ft 4 in 08/09/2022 01:50 pm BMI: 31.1 08/09/2022 01:51 pm Allergies Allergies not reviewed (last reviewed 04/06/2022) LATEX  NKDA Medications Reviewed Medications Balziva (28) 0.4 mg-35 mcg tablet TAKE 1 TABLET BY MOUTH EVERY DAY 08/07/22   filled surescripts tetracycline 250 mg capsule TAKE 1 CAPSULE BY MOUTH EVERY 6 HOURS FOR 6 DAYS 06/30/22   filled surescripts Vaccines Vaccines not reviewed (last reviewed 04/06/2022) completed 2 pfizer covid vaccines, 10-2019 Problems Problems not reviewed (last reviewed 04/06/2022) Uterine leiomyoma - Onset: 03/06/2021 Family History Reviewed Family History Mother - Vascular dementia (onset age: 38)   - Heart disease Maternal Grandfather - Cerebrovascular accident   - Diabetes mellitus Maternal Grandmother - Malignant tumor of lung Maternal Uncle - Malignant tumor of pancreas Social History Reviewed Social History Marriage and Sexuality What is your relationship status?: Single Are you sexually active?: No Marriage and Sexuality Continued History of domestic violence: No Education and Occupation Are you currently employed?: Yes 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 Date of Last Mammogram: 04/06/2022 (Notes: PWG/Negative). Date of Last Pap Smear: 04/06/2022 (Notes: WNL/Patient elects yearly paps.). History of Abnormal PAP: N. Sexually Active: N. History of Sexually Transmitted Infection: N. Current Birth Control Method:: Combined Oral Contraceptive Pills. History of Fibroids: Y. Obstetric History Reviewed Obstetric History TOTAL FULL PRE AB. I AB. S ECTOPICS MULTIPLE LIVING 1 1 0 0 0 0 0 1 12-14-2013, f, vag del Dr. Corinna Capra Past Pregnancies Date # Fetuses GA Wks Labor Length Birth Weight Sex Delivery Type Outcome Anesthesia Delivery Place Preterm Labor Notes Source 12/14/2013 1     f Vaginal Delivery   [place] [facility] [place]   episode Past Medical History Past Medical History not reviewed (last reviewed 04/06/2022) Hematology- Anemia: Y ID- Chicken Pox/Shingles: Y - shingles twice Screening None recorded. ROS Constitutional: Constitutional: no significant weight gain or loss and no fatigue or fever.  Skin: Skin: no rashes or abnormal moles.  Respiratory: Respiratory: no wheezing or dyspnea / shortness of breath.  Cardiovascular: Cardiovascular: no palpitations or chest pain.  Gastrointestinal: Gastrointestinal: no heartburn, dysphagia, nausea, vomiting, diarrhea, constipation, abdominal pain, bowel movement changes, or rectal bleeding.  Genitourinary: Genitourinary: no vaginal odor or itching; no hematuria, incontinence, rash, lesion, discharge, flank pain, or trouble urinating; and abnormal bleeding.  Endocrine: Menstrual: no menstrual problems or PMDD symptoms. Menopausal: no menopausal symptoms. Sexual: no  sexual problems.  Musculoskeletal: Musculoskeletal: no muscle aches or weakness and no arthralgias/joint pain or back pain.  Neurological: Neurologic: no headaches, dizziness, LOC, weakness, or numbness.  Psychological: Psych: no depression, alcoholism, or sleep disturbances. Physical Exam Constitutional: *General Appearance: healthy-appearing, well-nourished, and well-developed.  Head: Head: normocephalic.  Neck: *Thyroid: non-tender and no enlargement.  Lymph Nodes: *Palpation: normal.  Cardiovascular: *Auscultation: RRR and no murmur. *Peripheral Vascular: no varicosities or edema.  Lungs: *Auscultation: clear to auscultation. Inspection: normal and normal respiratory rate.  *Breast: Bilateral: no tenderness, skin changes, abnormal nipple secretions, or masses palpable and nipple appearance: normal. Right Breast: normal. Left Breast: normal.  Abdomen: *Inspection/Palpation/Auscultation: no tenderness, rebound, guarding, hepatomegaly, or splenomegaly and non-distended and soft. *Hernia: none palpated.  Back: Appearance normal. Palpation no costovertebral angle tenderness.  Female Genitalia: Vulva: no masses, atrophy, or lesions. Mons: no erythema, excoriation, atrophy, lesions, masses, swelling, tenderness, or vesicles/ ulcers and normal. Labia Majora: no erythema, excoriation, atrophy, discoloration, lesions, masses, swelling, tenderness, or vesicles/ ulcers and normal. Labia Minora: no erythema, excoriation, atrophy, discoloration, lesions, vesicles, masses, swelling, or tenderness and normal. Introitus: normal. Bartholin's Gland: normal. *Vagina: no discharge, erythema, atrophy, lesions, ulcers, swelling, masses, tenderness, prolapse, or blood present and normal. *Cervix: no lesions, discharge, bleeding, or cervical motion tenderness and grossly normal. *Uterus: fibroids, contour irregular, and enlarged 14 weeks size and midline, mobile, non-tender, and no uterine prolapse. *Urethral  Meatus/ Urethra: no discharge, masses, or tenderness and normal meatus and well supported urethra. *Bladder: non-distended, non-tender, and no palpable mass. *Adnexa/Parametria: no tenderness or mass palpable.  Extremities: Legs: normal.  Skin: *Appearance: no rashes or lesions.  Psychiatric: *Orientation: to person, place, and time. *Mood and Affect: normal mood and affect and active and alert. Assessment / Plan 1. Uterine leiomyoma - 14 weeks size cause pelvic pain and occas urinary retention seen in the ER. Now with pelvic pain, menorrhagia US shows interval growth by 1 cm in each fibroid in the last 6 months discussed options She wants to proceed with hysterectomy bilateral salpingectomy and preservation of her ovaries. Discussed the risks and benefits of the procedure including but not limited to infection, bleeding, damage to bowel, bladder, ureters, and risks associated with blood transfusion. We reviewed the procedure at length including the recovery. All here questions were answered and she gives informed consent. plan preservation of ovaries D25.9: Leiomyoma of uterus, unspecified  2. Pain in pelvis - discussed [place] report from recent ER visit and urologist. Feel like the pain is the large fibroids and mechanical kinking and obstuction of the bladder neck causing retention discussed surgical options and rec TAH R10.2: Pelvic and perineal pain  3. Menorrhagia - due to fibroids  This patient has been seen and examined.   All of her questions were answered.  Labs and vital signs reviewed.  Informed consent has been obtained.  The History and Physical is current.

## 2022-08-16 ENCOUNTER — Observation Stay (HOSPITAL_BASED_OUTPATIENT_CLINIC_OR_DEPARTMENT_OTHER): Payer: 59 | Admitting: Anesthesiology

## 2022-08-16 ENCOUNTER — Encounter (HOSPITAL_BASED_OUTPATIENT_CLINIC_OR_DEPARTMENT_OTHER): Payer: Self-pay | Admitting: Obstetrics and Gynecology

## 2022-08-16 ENCOUNTER — Other Ambulatory Visit: Payer: Self-pay

## 2022-08-16 ENCOUNTER — Observation Stay (HOSPITAL_BASED_OUTPATIENT_CLINIC_OR_DEPARTMENT_OTHER)
Admission: RE | Admit: 2022-08-16 | Discharge: 2022-08-17 | Disposition: A | Discharge status: Home / Self Care | Payer: 59 | Source: Ambulatory Visit | Attending: Obstetrics and Gynecology | Admitting: Obstetrics and Gynecology

## 2022-08-16 ENCOUNTER — Encounter (HOSPITAL_BASED_OUTPATIENT_CLINIC_OR_DEPARTMENT_OTHER)
Admission: RE | Disposition: A | Discharge status: Home / Self Care | Payer: Self-pay | Source: Ambulatory Visit | Attending: Obstetrics and Gynecology

## 2022-08-16 DIAGNOSIS — D259 Leiomyoma of uterus, unspecified: Principal | ICD-10-CM | POA: Insufficient documentation

## 2022-08-16 DIAGNOSIS — Z91040 Latex allergy status: Secondary | ICD-10-CM | POA: Insufficient documentation

## 2022-08-16 DIAGNOSIS — N8501 Benign endometrial hyperplasia: Secondary | ICD-10-CM | POA: Diagnosis not present

## 2022-08-16 DIAGNOSIS — Z90710 Acquired absence of both cervix and uterus: Secondary | ICD-10-CM | POA: Diagnosis present

## 2022-08-16 DIAGNOSIS — D260 Other benign neoplasm of cervix uteri: Secondary | ICD-10-CM | POA: Diagnosis not present

## 2022-08-16 DIAGNOSIS — N139 Obstructive and reflux uropathy, unspecified: Secondary | ICD-10-CM | POA: Diagnosis not present

## 2022-08-16 DIAGNOSIS — D282 Benign neoplasm of uterine tubes and ligaments: Secondary | ICD-10-CM | POA: Diagnosis not present

## 2022-08-16 DIAGNOSIS — J189 Pneumonia, unspecified organism: Secondary | ICD-10-CM

## 2022-08-16 DIAGNOSIS — Z01818 Encounter for other preprocedural examination: Secondary | ICD-10-CM

## 2022-08-16 HISTORY — PX: HYSTERECTOMY ABDOMINAL WITH SALPINGECTOMY: SHX6725

## 2022-08-16 HISTORY — DX: Personal history of other specified conditions: Z87.898

## 2022-08-16 HISTORY — DX: Presence of spectacles and contact lenses: Z97.3

## 2022-08-16 HISTORY — DX: Zoster without complications: B02.9

## 2022-08-16 LAB — TYPE AND SCREEN
ABO/RH(D): B POS
Antibody Screen: NEGATIVE

## 2022-08-16 LAB — POCT PREGNANCY, URINE: Preg Test, Ur: NEGATIVE

## 2022-08-16 LAB — ABO/RH: ABO/RH(D): B POS

## 2022-08-16 SURGERY — HYSTERECTOMY, TOTAL, ABDOMINAL, WITH SALPINGECTOMY
Anesthesia: General | Site: Abdomen | Laterality: Bilateral

## 2022-08-16 MED ORDER — ONDANSETRON HCL 4 MG PO TABS: 4.0000 mg | ORAL_TABLET | Freq: Four times a day (QID) | ORAL | Status: DC | PRN

## 2022-08-16 MED ORDER — AMISULPRIDE (ANTIEMETIC) 5 MG/2ML IV SOLN: 10.0000 mg | Freq: Once | INTRAVENOUS | Status: DC | PRN

## 2022-08-16 MED ORDER — IBUPROFEN 200 MG PO TABS
600.0000 mg | ORAL_TABLET | Freq: Four times a day (QID) | ORAL | Status: DC | PRN
  Administered 2022-08-17: 600 mg via ORAL

## 2022-08-16 MED ORDER — ONDANSETRON HCL 4 MG/2ML IJ SOLN
4.0000 mg | Freq: Four times a day (QID) | INTRAMUSCULAR | Status: DC | PRN
  Administered 2022-08-16: 4 mg via INTRAVENOUS

## 2022-08-16 MED ORDER — MIDAZOLAM HCL 5 MG/5ML IJ SOLN
INTRAMUSCULAR | Status: DC | PRN
  Administered 2022-08-16: 2 mg via INTRAVENOUS

## 2022-08-16 MED ORDER — EPHEDRINE 5 MG/ML INJ
INTRAVENOUS | Status: AC
  Filled 2022-08-16: qty 5

## 2022-08-16 MED ORDER — KETOROLAC TROMETHAMINE 30 MG/ML IJ SOLN
INTRAMUSCULAR | Status: AC
  Filled 2022-08-16: qty 1

## 2022-08-16 MED ORDER — PROPOFOL 10 MG/ML IV BOLUS
INTRAVENOUS | Status: DC | PRN
  Administered 2022-08-16: 200 mg via INTRAVENOUS

## 2022-08-16 MED ORDER — OXYCODONE HCL 5 MG/5ML PO SOLN: 5.0000 mg | Freq: Once | ORAL | Status: DC | PRN

## 2022-08-16 MED ORDER — ACETAMINOPHEN 500 MG PO TABS
ORAL_TABLET | ORAL | Status: AC
  Filled 2022-08-16: qty 2

## 2022-08-16 MED ORDER — KETOROLAC TROMETHAMINE 30 MG/ML IJ SOLN: INTRAMUSCULAR | Status: DC | PRN

## 2022-08-16 MED ORDER — ONDANSETRON HCL 4 MG/2ML IJ SOLN
INTRAMUSCULAR | Status: DC | PRN
  Administered 2022-08-16: 4 mg via INTRAVENOUS

## 2022-08-16 MED ORDER — KETAMINE HCL 50 MG/5ML IJ SOSY
PREFILLED_SYRINGE | INTRAMUSCULAR | Status: AC
  Filled 2022-08-16: qty 5

## 2022-08-16 MED ORDER — ONDANSETRON HCL 4 MG/2ML IJ SOLN
INTRAMUSCULAR | Status: AC
  Filled 2022-08-16: qty 4

## 2022-08-16 MED ORDER — HYDROMORPHONE HCL 1 MG/ML IJ SOLN
INTRAMUSCULAR | Status: AC
  Filled 2022-08-16: qty 1

## 2022-08-16 MED ORDER — PHENYLEPHRINE 80 MCG/ML (10ML) SYRINGE FOR IV PUSH (FOR BLOOD PRESSURE SUPPORT)
PREFILLED_SYRINGE | INTRAVENOUS | Status: AC
  Filled 2022-08-16: qty 10

## 2022-08-16 MED ORDER — BUPIVACAINE LIPOSOME 1.3 % IJ SUSP
INTRAMUSCULAR | Status: DC | PRN
  Administered 2022-08-16: 20 mL

## 2022-08-16 MED ORDER — SODIUM CHLORIDE 0.9 % IV SOLN
2.0000 g | INTRAVENOUS | Status: AC
  Administered 2022-08-16: 2 g via INTRAVENOUS

## 2022-08-16 MED ORDER — LIDOCAINE HCL (PF) 2 % IJ SOLN
INTRAMUSCULAR | Status: AC
  Filled 2022-08-16: qty 10

## 2022-08-16 MED ORDER — ACETAMINOPHEN 500 MG PO TABS
1000.0000 mg | ORAL_TABLET | Freq: Once | ORAL | Status: AC
  Administered 2022-08-16: 1000 mg via ORAL

## 2022-08-16 MED ORDER — SCOPOLAMINE 1 MG/3DAYS TD PT72
1.0000 | MEDICATED_PATCH | TRANSDERMAL | Status: DC
  Administered 2022-08-16: 1.5 mg via TRANSDERMAL

## 2022-08-16 MED ORDER — PROPOFOL 10 MG/ML IV BOLUS
INTRAVENOUS | Status: AC
  Filled 2022-08-16: qty 20

## 2022-08-16 MED ORDER — FENTANYL CITRATE (PF) 100 MCG/2ML IJ SOLN
INTRAMUSCULAR | Status: DC | PRN
  Administered 2022-08-16 (×2): 100 ug via INTRAVENOUS

## 2022-08-16 MED ORDER — OXYCODONE HCL 5 MG PO TABS: 5.0000 mg | ORAL_TABLET | Freq: Once | ORAL | Status: DC | PRN

## 2022-08-16 MED ORDER — ROCURONIUM BROMIDE 10 MG/ML (PF) SYRINGE
PREFILLED_SYRINGE | INTRAVENOUS | Status: DC | PRN
  Administered 2022-08-16: 50 mg via INTRAVENOUS

## 2022-08-16 MED ORDER — ONDANSETRON HCL 4 MG/2ML IJ SOLN
INTRAMUSCULAR | Status: AC
  Filled 2022-08-16: qty 2

## 2022-08-16 MED ORDER — OXYCODONE HCL 5 MG PO TABS
ORAL_TABLET | ORAL | Status: AC
  Filled 2022-08-16: qty 1

## 2022-08-16 MED ORDER — DEXAMETHASONE SODIUM PHOSPHATE 10 MG/ML IJ SOLN
INTRAMUSCULAR | Status: AC
  Filled 2022-08-16: qty 1

## 2022-08-16 MED ORDER — KETOROLAC TROMETHAMINE 15 MG/ML IJ SOLN
30.0000 mg | Freq: Once | INTRAMUSCULAR | Status: AC
  Administered 2022-08-16: 30 mg via INTRAVENOUS

## 2022-08-16 MED ORDER — SODIUM CHLORIDE 0.9 % IV SOLN
25.0000 mg | Freq: Once | INTRAVENOUS | Status: AC
  Administered 2022-08-16: 25 mg via INTRAVENOUS
  Filled 2022-08-16: qty 25

## 2022-08-16 MED ORDER — DEXAMETHASONE SODIUM PHOSPHATE 10 MG/ML IJ SOLN
INTRAMUSCULAR | Status: DC | PRN
  Administered 2022-08-16: 10 mg via INTRAVENOUS

## 2022-08-16 MED ORDER — ALUM & MAG HYDROXIDE-SIMETH 200-200-20 MG/5ML PO SUSP: 30.0000 mL | ORAL | Status: DC | PRN

## 2022-08-16 MED ORDER — SODIUM CHLORIDE 0.9 % IV SOLN
INTRAVENOUS | Status: AC
  Filled 2022-08-16: qty 2

## 2022-08-16 MED ORDER — SUGAMMADEX SODIUM 200 MG/2ML IV SOLN
INTRAVENOUS | Status: DC | PRN
  Administered 2022-08-16: 200 mg via INTRAVENOUS

## 2022-08-16 MED ORDER — LIDOCAINE 2% (20 MG/ML) 5 ML SYRINGE
INTRAMUSCULAR | Status: DC | PRN
  Administered 2022-08-16: 60 mg via INTRAVENOUS

## 2022-08-16 MED ORDER — ZOLPIDEM TARTRATE 5 MG PO TABS: 5.0000 mg | ORAL_TABLET | Freq: Every evening | ORAL | Status: DC | PRN

## 2022-08-16 MED ORDER — ACETAMINOPHEN 500 MG PO TABS
1000.0000 mg | ORAL_TABLET | Freq: Four times a day (QID) | ORAL | Status: DC
  Administered 2022-08-16 – 2022-08-17 (×4): 1000 mg via ORAL

## 2022-08-16 MED ORDER — MIDAZOLAM HCL 2 MG/2ML IJ SOLN
INTRAMUSCULAR | Status: AC
  Filled 2022-08-16: qty 2

## 2022-08-16 MED ORDER — HYDROMORPHONE HCL 1 MG/ML IJ SOLN
0.2000 mg | INTRAMUSCULAR | Status: DC | PRN
  Administered 2022-08-16 (×2): 0.5 mg via INTRAVENOUS

## 2022-08-16 MED ORDER — SIMETHICONE 80 MG PO CHEW: 80.0000 mg | CHEWABLE_TABLET | Freq: Four times a day (QID) | ORAL | Status: DC | PRN

## 2022-08-16 MED ORDER — KETAMINE HCL 10 MG/ML IJ SOLN
INTRAMUSCULAR | Status: DC | PRN
  Administered 2022-08-16: 20 mg via INTRAVENOUS

## 2022-08-16 MED ORDER — ROCURONIUM BROMIDE 10 MG/ML (PF) SYRINGE
PREFILLED_SYRINGE | INTRAVENOUS | Status: AC
  Filled 2022-08-16: qty 10

## 2022-08-16 MED ORDER — OXYCODONE HCL 5 MG PO TABS
5.0000 mg | ORAL_TABLET | ORAL | Status: DC | PRN
  Administered 2022-08-16: 10 mg via ORAL
  Administered 2022-08-16 (×2): 5 mg via ORAL
  Administered 2022-08-17 (×2): 10 mg via ORAL

## 2022-08-16 MED ORDER — MENTHOL 3 MG MT LOZG
LOZENGE | OROMUCOSAL | Status: AC
  Filled 2022-08-16: qty 9

## 2022-08-16 MED ORDER — OXYCODONE HCL 5 MG PO TABS
ORAL_TABLET | ORAL | Status: AC
  Filled 2022-08-16: qty 2

## 2022-08-16 MED ORDER — EPHEDRINE SULFATE-NACL 50-0.9 MG/10ML-% IV SOSY
PREFILLED_SYRINGE | INTRAVENOUS | Status: DC | PRN
  Administered 2022-08-16: 10 mg via INTRAVENOUS

## 2022-08-16 MED ORDER — HYDROMORPHONE HCL 1 MG/ML IJ SOLN
0.2500 mg | INTRAMUSCULAR | Status: DC | PRN
  Administered 2022-08-16 (×3): 0.25 mg via INTRAVENOUS

## 2022-08-16 MED ORDER — LACTATED RINGERS IV SOLN: INTRAVENOUS | Status: DC

## 2022-08-16 MED ORDER — FENTANYL CITRATE (PF) 250 MCG/5ML IJ SOLN
INTRAMUSCULAR | Status: AC
  Filled 2022-08-16: qty 5

## 2022-08-16 MED ORDER — SCOPOLAMINE 1 MG/3DAYS TD PT72
MEDICATED_PATCH | TRANSDERMAL | Status: AC
  Filled 2022-08-16: qty 1

## 2022-08-16 MED ORDER — DEXTROSE-NACL 5-0.45 % IV SOLN: INTRAVENOUS | Status: DC

## 2022-08-16 MED ORDER — POVIDONE-IODINE 10 % EX SWAB: 2.0000 | Freq: Once | CUTANEOUS | Status: DC

## 2022-08-16 MED ORDER — 0.9 % SODIUM CHLORIDE (POUR BTL) OPTIME
TOPICAL | Status: DC | PRN
  Administered 2022-08-16: 2000 mL

## 2022-08-16 MED ORDER — MENTHOL 3 MG MT LOZG
1.0000 | LOZENGE | OROMUCOSAL | Status: DC | PRN
  Administered 2022-08-16: 3 mg via ORAL

## 2022-08-16 SURGICAL SUPPLY — 40 items
BLADE SURG 15 STRL LF DISP TIS (BLADE) IMPLANT
BLADE SURG 15 STRL SS (BLADE) ×1
CLOTH BEACON ORANGE TIMEOUT ST (SAFETY) ×1 IMPLANT
DRAPE WARM FLUID 44X44 (DRAPES) ×1 IMPLANT
DRSG OPSITE POSTOP 4X10 (GAUZE/BANDAGES/DRESSINGS) ×1 IMPLANT
DURAPREP 26ML APPLICATOR (WOUND CARE) ×1 IMPLANT
GAUZE 4X4 16PLY ~~LOC~~+RFID DBL (SPONGE) IMPLANT
GLOVE BIOGEL PI IND STRL 7.0 (GLOVE) ×4 IMPLANT
GLOVE SURG ORTHO 8.0 STRL STRW (GLOVE) ×1 IMPLANT
GLOVE SURG POLYISO LF SZ5.5 (GLOVE) IMPLANT
GOWN STRL REUS W/TWL LRG LVL3 (GOWN DISPOSABLE) ×2 IMPLANT
HEMOSTAT ARISTA ABSORB 3G PWDR (HEMOSTASIS) IMPLANT
KIT TURNOVER CYSTO (KITS) ×1 IMPLANT
LIGASURE IMPACT 36 18CM CVD LR (INSTRUMENTS) ×1 IMPLANT
NDL HYPO 25X1 1.5 SAFETY (NEEDLE) IMPLANT
NEEDLE HYPO 25X1 1.5 SAFETY (NEEDLE) ×1 IMPLANT
NS IRRIG 500ML POUR BTL (IV SOLUTION) ×2 IMPLANT
PACK ABDOMINAL GYN (CUSTOM PROCEDURE TRAY) ×1 IMPLANT
PAD OB MATERNITY 4.3X12.25 (PERSONAL CARE ITEMS) ×1 IMPLANT
PROTECTOR NERVE ULNAR (MISCELLANEOUS) ×2 IMPLANT
RETRACTOR WND ALEXIS 25 LRG (MISCELLANEOUS) IMPLANT
RTRCTR WOUND ALEXIS 25CM LRG (MISCELLANEOUS)
SPONGE T-LAP 18X18 ~~LOC~~+RFID (SPONGE) IMPLANT
SUT MNCRL 0 MO-4 VIOLET 18 CR (SUTURE) ×1 IMPLANT
SUT MNCRL 0 VIOLET 6X18 (SUTURE) ×1 IMPLANT
SUT MNCRL AB 3-0 PS2 27 (SUTURE) ×1 IMPLANT
SUT MON AB 2-0 SH 27 (SUTURE) IMPLANT
SUT MONOCRYL 0 6X18 (SUTURE) ×1
SUT MONOCRYL 0 MO 4 18  CR/8 (SUTURE) ×1
SUT PDS AB 0 CT1 27 (SUTURE) IMPLANT
SUT PDS AB 0 CTX 60 (SUTURE) IMPLANT
SUT PLAIN 3 0 CT 1 27 (SUTURE) ×1 IMPLANT
SUT VIC AB 0 CT1 18XCR BRD8 (SUTURE) IMPLANT
SUT VIC AB 0 CT1 8-18 (SUTURE)
SUT VIC AB 1 CT1 36 (SUTURE) ×1 IMPLANT
SUT VIC AB 2-0 CT1 (SUTURE) ×1 IMPLANT
SYR BULB IRRIG 60ML STRL (SYRINGE) ×1 IMPLANT
SYR CONTROL 10ML LL (SYRINGE) IMPLANT
TOWEL OR 17X26 10 PK STRL BLUE (TOWEL DISPOSABLE) ×1 IMPLANT
TRAY FOLEY W/BAG SLVR 14FR LF (SET/KITS/TRAYS/PACK) ×1 IMPLANT

## 2022-08-16 NOTE — Transfer of Care (Signed)
Immediate Anesthesia Transfer of Care Note  Patient: Elizabeth King  Procedure(s) Performed: HYSTERECTOMY ABDOMINAL WITH SALPINGECTOMY (Bilateral: Abdomen)  Patient Location: PACU  Anesthesia Type:General  Level of Consciousness: drowsy, patient cooperative, and responds to stimulation  Airway & Oxygen Therapy: Patient Spontanous Breathing  Post-op Assessment: Report given to RN and Post -op Vital signs reviewed and stable  Post vital signs: Reviewed and stable  Last Vitals:  Vitals Value Taken Time  BP 141/82 08/16/22 0856  Temp 36.4 C 08/16/22 0857  Pulse 98 08/16/22 0858  Resp 18 08/16/22 0858  SpO2 92 % 08/16/22 0858  Vitals shown include unvalidated device data.  Last Pain:  Vitals:   08/16/22 0857  TempSrc:   PainSc: Asleep      Patients Stated Pain Goal: 6 (07/61/51 8343)  Complications: No notable events documented.

## 2022-08-16 NOTE — Anesthesia Preprocedure Evaluation (Addendum)
Anesthesia Evaluation  Patient identified by MRN, date of birth, ID band Patient awake    Reviewed: Allergy & Precautions, NPO status , Patient's Chart, lab work & pertinent test results  Airway Mallampati: II  TM Distance: >3 FB Neck ROM: Full    Dental  (+) Dental Advisory Given   Pulmonary neg pulmonary ROS   breath sounds clear to auscultation       Cardiovascular negative cardio ROS  Rhythm:Regular Rate:Normal     Neuro/Psych negative neurological ROS     GI/Hepatic negative GI ROS, Neg liver ROS,,,  Endo/Other  negative endocrine ROS    Renal/GU negative Renal ROS     Musculoskeletal   Abdominal   Peds  Hematology  (+) Blood dyscrasia, anemia   Anesthesia Other Findings   Reproductive/Obstetrics                             Anesthesia Physical Anesthesia Plan  ASA: 2  Anesthesia Plan: General   Post-op Pain Management: Tylenol PO (pre-op)*, Toradol IV (intra-op)*, Ketamine IV* and Dilaudid IV   Induction: Intravenous  PONV Risk Score and Plan: 4 or greater and Dexamethasone, Midazolam, Scopolamine patch - Pre-op, Treatment may vary due to age or medical condition and Ondansetron  Airway Management Planned: Oral ETT  Additional Equipment: None  Intra-op Plan:   Post-operative Plan: Extubation in OR  Informed Consent: I have reviewed the patients History and Physical, chart, labs and discussed the procedure including the risks, benefits and alternatives for the proposed anesthesia with the patient or authorized representative who has indicated his/her understanding and acceptance.     Dental advisory given  Plan Discussed with: CRNA  Anesthesia Plan Comments:        Anesthesia Quick Evaluation

## 2022-08-16 NOTE — Op Note (Addendum)
Elizabeth King, [place] MEDICAL RECORD NO: [id number] ACCOUNT NO: [id number] DATE OF BIRTH: 1975/09/03 FACILITY: [place] [facility] LOCATION: [facility] PHYSICIAN: Monia Sabal. Corinna Capra, MD  Operative Report   DATE OF PROCEDURE: 08/16/2022   PREOPERATIVE DIAGNOSIS:  A 14-week size fibroid uterus, pelvic pain, menorrhagia and history of uterine retention due to mechanical obstruction by the fibroids.  POSTOPERATIVE DIAGNOSIS:  A 14-week size fibroid uterus, pelvic pain, menorrhagia and history of uterine retention due to mechanical obstruction by the fibroids.  PROCEDURE:  Total abdominal hysterectomy with bilateral salpingectomy.  SURGEON:  Dr. Louretta Shorten, MD  ASSISTANT:  Dr Hurshel Party, MD  ANESTHESIA:  General endotracheal.  INDICATIONS:  The patient is a 47 year old G1 P1 14-week size fibroid uterus with progressive growth noticed in the office, recent admission to the emergency room due to mechanical obstruction of the fibroid.  She has also complained of menorrhagia and  pelvic pain.  This has not been controlled on oral contraceptive agents.  She presents for hysterectomy.  We discussed the risks and benefits at length including but not limited to risk of infection, bleeding, damage to the bowel, bladder, ureters, risks  associated with anesthesia and blood transfusion.  She gives informed consent and wished to proceed.   FINDINGS AT TIME OF SURGERY:  A 14-week size fibroid uterus, normal-appearing ovaries.  The appendix was retrocecal.  DESCRIPTION OF PROCEDURE:  After adequate analgesia the patient was placed in the supine position.  She was sterilely prepped and draped.  Bladder was sterilely drained by the Foley catheter.  Patient was given 2 grams of cefotetan.  A Pfannenstiel skin  incision was made 2 fingerbreadths above the pubic symphysis taken down sharply to fascia which was incised transversely extended superiorly and inferiorly off the bellies of the rectus muscles, which were separated  sharply in the midline.  The uterus  was identified.  The fundus was grasped with a thyroid tenaculum.  The uterus was elevated into the operative field.  O'Connor-O'Sullivan retractor was placed and the bowel was packed cephalad.  The round ligaments were identified and suture ligated with  0 Monocryl suture.  LigaSure instrument was used to ligate across the mesosalpinx, utero-ovarian ligaments down across the round ligament bilaterally with the ovaries maintaining good vasculature and following laterally to the pelvic sidewall.  The  bladder was then dissected off of the anterior surface of the cervix.  The LigaSure instrument was then used to ligate and dissect across the uterine vessels down across the cardinal ligaments down to the inferior portion of the cervix.  Heaney clamp was  then placed across the uterosacral ligaments bilaterally and suture ligated with 0 Monocryl suture in a figure-of-eight.  Heaney clamps were then placed across the vaginal angles, Jorgenson scissors were then used to open the vagina and dissect the  cervix free from the vagina.  The cervix appeared to be intact with the uterus and was passed off the field.  The vagina was then closed with a figure-of-eight of 0 Monocryl suture.  The angles and then figure-of-eights across the midportion of the  vagina with good approximation and good hemostasis was achieved.  The uterosacral ligaments were then plicated in the midline with good support noted at the vaginal apex.  Irrigation was applied and adequate hemostasis was assured.  Reexamination of the  pedicles revealed good hemostasis.  The ureters were well away from the dissection line.  The ovaries were then ligated to the round ligaments given good ovarian support.  The bowel packing  was then removed.  O'Connor-O'Sullivan retractor was removed.   The peritoneum was then closed with a 2-0 Vicryl Rapide.  The fascia was then closed with a #1 Vicryl with good approximation, good  hemostasis noted.  The incision was injected with 0.25% Exparel.  The skin was then closed with a 3-0 Monocryl  subcuticular suture with good cosmetic result and good hemostasis.  The patient was stable and transferred to recovery room.  Sponge and needle count was normal x3.  Estimated blood loss 50 mL.  The patient was stable and transferred to recovery room.     [email or web address] D: 08/16/2022 8:52:28 am T: 08/16/2022 9:39:00 am  JOB: [id number]

## 2022-08-16 NOTE — Anesthesia Postprocedure Evaluation (Signed)
Anesthesia Post Note  Patient: Elizabeth King  Procedure(s) Performed: HYSTERECTOMY ABDOMINAL WITH SALPINGECTOMY (Bilateral: Abdomen)     Patient location during evaluation: PACU Anesthesia Type: General Level of consciousness: awake and alert Pain management: pain level controlled Vital Signs Assessment: post-procedure vital signs reviewed and stable Respiratory status: spontaneous breathing, nonlabored ventilation, respiratory function stable and patient connected to nasal cannula oxygen Cardiovascular status: blood pressure returned to baseline and stable Postop Assessment: no apparent nausea or vomiting Anesthetic complications: no  No notable events documented.  Last Vitals:  Vitals:   08/16/22 1000 08/16/22 1030  BP: 113/71 117/75  Pulse: 69 66  Resp: 15 16  Temp: 36.4 C   SpO2: 94% 96%    Last Pain:  Vitals:   08/16/22 1000  TempSrc:   PainSc: 5                  Tiajuana Amass

## 2022-08-16 NOTE — Anesthesia Procedure Notes (Signed)
Procedure Name: Intubation Date/Time: 08/16/2022 7:37 AM  Performed by: Rogers Blocker, CRNAPre-anesthesia Checklist: Patient identified, Emergency Drugs available, Suction available and Patient being monitored Patient Re-evaluated:Patient Re-evaluated prior to induction Oxygen Delivery Method: Circle System Utilized Preoxygenation: Pre-oxygenation with 100% oxygen Induction Type: IV induction Ventilation: Mask ventilation without difficulty Laryngoscope Size: Mac and 3 Grade View: Grade I Tube type: Oral Tube size: 7.0 mm Number of attempts: 1 Airway Equipment and Method: Stylet and Bite block Placement Confirmation: ETT inserted through vocal cords under direct vision, positive ETCO2 and breath sounds checked- equal and bilateral Secured at: 22 cm Tube secured with: Tape Dental Injury: Teeth and Oropharynx as per pre-operative assessment

## 2022-08-16 NOTE — Progress Notes (Signed)
Day of Surgery Procedure(s) (LRB): HYSTERECTOMY ABDOMINAL WITH SALPINGECTOMY (Bilateral)  Subjective: Patient reports nausea and vomiting.    Objective: I have reviewed patient's vital signs, intake and output, medications, and surgery .  GI: BS hypoactive.  Abd soft nontender, mild distension Vaginal Bleeding: none  Assessment: s/p Procedure(s): HYSTERECTOMY ABDOMINAL WITH SALPINGECTOMY (Bilateral): stable  Plan: Encouraged clear liquids and ambulation  LOS: 0 days    [name], MD 08/16/2022, 5:59 PM

## 2022-08-17 ENCOUNTER — Encounter (HOSPITAL_BASED_OUTPATIENT_CLINIC_OR_DEPARTMENT_OTHER): Payer: Self-pay | Admitting: Obstetrics and Gynecology

## 2022-08-17 DIAGNOSIS — D259 Leiomyoma of uterus, unspecified: Secondary | ICD-10-CM | POA: Diagnosis not present

## 2022-08-17 LAB — CBC
HCT: 38 % (ref 36.0–46.0)
Hemoglobin: 12.5 g/dL (ref 12.0–15.0)
MCH: 28.7 pg (ref 26.0–34.0)
MCHC: 32.9 g/dL (ref 30.0–36.0)
MCV: 87.2 fL (ref 80.0–100.0)
Platelets: 330 10*3/uL (ref 150–400)
RBC: 4.36 MIL/uL (ref 3.87–5.11)
RDW: 13.9 % (ref 11.5–15.5)
WBC: 10.7 10*3/uL — ABNORMAL HIGH (ref 4.0–10.5)
nRBC: 0 % (ref 0.0–0.2)

## 2022-08-17 MED ORDER — OXYCODONE HCL 5 MG PO TABS: 5.0000 mg | ORAL_TABLET | ORAL | 0 refills | Status: AC | PRN

## 2022-08-17 MED ORDER — IBUPROFEN 200 MG PO TABS
ORAL_TABLET | ORAL | Status: AC
  Filled 2022-08-17: qty 3

## 2022-08-17 MED ORDER — OXYCODONE HCL 5 MG PO TABS
ORAL_TABLET | ORAL | Status: AC
  Filled 2022-08-17: qty 2

## 2022-08-17 MED ORDER — ACETAMINOPHEN 500 MG PO TABS
ORAL_TABLET | ORAL | Status: AC
  Filled 2022-08-17: qty 2

## 2022-08-17 MED ORDER — IBUPROFEN 600 MG PO TABS: 600.0000 mg | ORAL_TABLET | Freq: Four times a day (QID) | ORAL | 0 refills | Status: AC | PRN

## 2022-08-17 MED ORDER — PROMETHAZINE HCL 25 MG PO TABS: 25.0000 mg | ORAL_TABLET | Freq: Four times a day (QID) | ORAL | 0 refills | Status: AC | PRN

## 2022-08-17 NOTE — Discharge Summary (Signed)
Physician Discharge Summary  Patient ID: Elizabeth King MRN: [id number] DOB/AGE: 1975-08-08 47 y.o.  Admit date: 08/16/2022 Discharge date: 08/17/2022  Admission Diagnoses:large fibroids, pelvic pain, menorrhagia, urinary obstruction  Discharge Diagnoses: same Principal Problem:   S/P TAH (total abdominal hysterectomy)   Discharged Condition: good  Hospital Course: Elizabeth King underwent an uncomplicated TAH/BS with EBL 50cc.  Her post op course was complicated by post op nausea which improved with antiemetics, IVF.  Post op D 1 ambulating well tolerating regular diet and pain well controlled on oral meds.  D/c home  Consults: None  Significant Diagnostic Studies: labs: post op Hgb 12.5  Treatments: surgery: TAH/BS  Discharge Exam: Blood pressure (!) 147/82, pulse 70, temperature 98.7 F (37.1 C), resp. rate 20, height '5\' 4"'$  (1.626 m), weight 82.6 kg, last menstrual period 08/04/2022, SpO2 99 %, unknown if currently breastfeeding. General appearance: alert, cooperative, appears stated age, and no distress GI: soft, non-tender; bowel sounds normal; no masses,  no organomegaly Extremities: extremities normal, atraumatic, no cyanosis or edema Incision/Wound:CD&I  Disposition: Discharge disposition: 01-Home or Self Care       Discharge Instructions     Call MD for:  difficulty breathing, headache or visual disturbances   Complete by: As directed    Call MD for:  persistant nausea and vomiting   Complete by: As directed    Call MD for:  redness, tenderness, or signs of infection (pain, swelling, redness, odor or green/yellow discharge around incision site)   Complete by: As directed    Call MD for:  severe uncontrolled pain   Complete by: As directed    Call MD for:  temperature >100.4   Complete by: As directed    Driving Restrictions   Complete by: As directed    No driving for 2 weeks   Increase activity slowly   Complete by: As directed    Lifting restrictions    Complete by: As directed    No lifting anything greater than 10 pounds (if you have to ask, don't lift it)   Sexual Activity Restrictions   Complete by: As directed    Nothing in the vagina for 6 weeks      Allergies as of 08/17/2022       Reactions   Latex Itching        Medication List     STOP taking these medications    Balziva 0.4-35 MG-MCG tablet Generic drug: norethindrone-ethinyl estradiol       TAKE these medications    ibuprofen 200 MG tablet Commonly known as: ADVIL Take 400 mg by mouth every 6 (six) hours as needed for headache or moderate pain. What changed: Another medication with the same name was added. Make sure you understand how and when to take each.   ibuprofen 600 MG tablet Commonly known as: ADVIL Take 1 tablet (600 mg total) by mouth every 6 (six) hours as needed for moderate pain or mild pain. What changed: You were already taking a medication with the same name, and this prescription was added. Make sure you understand how and when to take each.   MULTI-DAY PO Take 1 tablet by mouth daily.   oxyCODONE 5 MG immediate release tablet Commonly known as: Oxy IR/ROXICODONE Take 1-2 tablets (5-10 mg total) by mouth every 4 (four) hours as needed for moderate pain or severe pain.   promethazine 25 MG tablet Commonly known as: PHENERGAN Take 1 tablet (25 mg total) by mouth every 6 (six) hours as needed  for nausea or vomiting (make take 1/2 to 1 tablet every 6 hours prn nausea).   UNABLE TO FIND daily. Med Name: Balance of nature fruit and veggie vitamins.         Signed: [name] 08/17/2022, 8:56 AM

## 2022-08-18 LAB — SURGICAL PATHOLOGY

## 2023-04-28 ENCOUNTER — Other Ambulatory Visit: Payer: Self-pay | Admitting: Internal Medicine

## 2023-04-28 ENCOUNTER — Encounter: Payer: Self-pay | Admitting: Internal Medicine

## 2023-04-28 DIAGNOSIS — N63 Unspecified lump in unspecified breast: Secondary | ICD-10-CM

## 2023-04-29 ENCOUNTER — Ambulatory Visit
Admission: RE | Admit: 2023-04-29 | Discharge: 2023-04-29 | Disposition: A | Discharge status: Home / Self Care | Payer: 59 | Source: Ambulatory Visit | Attending: Internal Medicine

## 2023-04-29 ENCOUNTER — Ambulatory Visit
Admission: RE | Admit: 2023-04-29 | Discharge: 2023-04-29 | Disposition: A | Discharge status: Home / Self Care | Payer: 59 | Source: Ambulatory Visit | Attending: Internal Medicine | Admitting: Internal Medicine

## 2023-04-29 DIAGNOSIS — N63 Unspecified lump in unspecified breast: Secondary | ICD-10-CM

## 2023-05-24 ENCOUNTER — Other Ambulatory Visit: Payer: 59
